# Patient Record
Sex: Male | Born: 1960
Health system: Southern US, Community
[De-identification: ages and names within clinical notes are randomized; demographics above are authoritative.]

## PROBLEM LIST (undated history)

## (undated) DIAGNOSIS — E785 Hyperlipidemia, unspecified: Secondary | ICD-10-CM

## (undated) DIAGNOSIS — F419 Anxiety disorder, unspecified: Secondary | ICD-10-CM

## (undated) HISTORY — PX: VASECTOMY: SHX75

## (undated) HISTORY — DX: Hyperlipidemia, unspecified: E78.5

## (undated) HISTORY — DX: Anxiety disorder, unspecified: F41.9

## (undated) HISTORY — PX: RHINOPLASTY: SUR1284

---

## 2005-07-02 ENCOUNTER — Ambulatory Visit: Payer: Self-pay | Admitting: Family Medicine

## 2005-07-02 LAB — CONVERTED CEMR LAB: PSA: 0.27 ng/mL

## 2006-06-20 ENCOUNTER — Encounter: Payer: Self-pay | Admitting: Family Medicine

## 2006-08-17 ENCOUNTER — Encounter: Payer: Self-pay | Admitting: Family Medicine

## 2008-01-18 ENCOUNTER — Encounter: Payer: Self-pay | Admitting: Family Medicine

## 2010-01-14 ENCOUNTER — Telehealth (INDEPENDENT_AMBULATORY_CARE_PROVIDER_SITE_OTHER): Payer: Self-pay | Admitting: *Deleted

## 2010-01-16 ENCOUNTER — Ambulatory Visit: Payer: Self-pay | Admitting: Family Medicine

## 2010-01-17 LAB — CONVERTED CEMR LAB
ALT: 17 units/L (ref 0–53)
AST: 19 units/L (ref 0–37)
Albumin: 4 g/dL (ref 3.5–5.2)
Alkaline Phosphatase: 79 units/L (ref 39–117)
BUN: 15 mg/dL (ref 6–23)
Basophils Absolute: 0 10*3/uL (ref 0.0–0.1)
Basophils Relative: 0.5 % (ref 0.0–3.0)
Bilirubin, Direct: 0.1 mg/dL (ref 0.0–0.3)
CO2: 31 meq/L (ref 19–32)
Calcium: 9.5 mg/dL (ref 8.4–10.5)
Chloride: 103 meq/L (ref 96–112)
Cholesterol: 214 mg/dL — ABNORMAL HIGH (ref 0–200)
Creatinine, Ser: 0.9 mg/dL (ref 0.4–1.5)
Direct LDL: 132 mg/dL
Eosinophils Absolute: 0.2 10*3/uL (ref 0.0–0.7)
Eosinophils Relative: 3.3 % (ref 0.0–5.0)
GFR calc non Af Amer: 98.33 mL/min (ref >60.00)
Glucose, Bld: 94 mg/dL (ref 70–99)
HCT: 42.1 % (ref 39.0–52.0)
HDL: 55.4 mg/dL (ref >39.00)
Hemoglobin: 14.3 g/dL (ref 13.0–17.0)
Lymphocytes Relative: 31.7 % (ref 12.0–46.0)
Lymphs Abs: 1.5 10*3/uL (ref 0.7–4.0)
MCHC: 33.9 g/dL (ref 30.0–36.0)
MCV: 94.2 fL (ref 78.0–100.0)
Monocytes Absolute: 0.5 10*3/uL (ref 0.1–1.0)
Monocytes Relative: 10.4 % (ref 3.0–12.0)
Neutro Abs: 2.5 10*3/uL (ref 1.4–7.7)
Neutrophils Relative %: 54.1 % (ref 43.0–77.0)
PSA: 0.31 ng/mL (ref 0.10–4.00)
Platelets: 269 10*3/uL (ref 150.0–400.0)
Potassium: 4.6 meq/L (ref 3.5–5.1)
RBC: 4.47 M/uL (ref 4.22–5.81)
RDW: 13.9 % (ref 11.5–14.6)
Sodium: 141 meq/L (ref 135–145)
TSH: 1.51 microintl units/mL (ref 0.35–5.50)
Total Bilirubin: 0.8 mg/dL (ref 0.3–1.2)
Total CHOL/HDL Ratio: 4
Total Protein: 6.4 g/dL (ref 6.0–8.3)
Triglycerides: 85 mg/dL (ref 0.0–149.0)
VLDL: 17 mg/dL (ref 0.0–40.0)
WBC: 4.6 10*3/uL (ref 4.5–10.5)

## 2010-01-21 ENCOUNTER — Ambulatory Visit: Payer: Self-pay | Admitting: Family Medicine

## 2010-01-21 ENCOUNTER — Encounter: Payer: Self-pay | Admitting: Family Medicine

## 2010-01-21 DIAGNOSIS — H9319 Tinnitus, unspecified ear: Secondary | ICD-10-CM | POA: Insufficient documentation

## 2010-01-21 DIAGNOSIS — F4389 Other reactions to severe stress: Secondary | ICD-10-CM | POA: Insufficient documentation

## 2010-01-21 DIAGNOSIS — F438 Other reactions to severe stress: Secondary | ICD-10-CM

## 2010-01-21 DIAGNOSIS — R079 Chest pain, unspecified: Secondary | ICD-10-CM | POA: Insufficient documentation

## 2010-02-04 ENCOUNTER — Encounter: Payer: Self-pay | Admitting: Family Medicine

## 2010-02-06 ENCOUNTER — Ambulatory Visit
Admission: RE | Admit: 2010-02-06 | Discharge: 2010-02-06 | Payer: Self-pay | Source: Home / Self Care | Attending: Family Medicine | Admitting: Family Medicine

## 2010-02-06 ENCOUNTER — Other Ambulatory Visit: Payer: Self-pay | Admitting: Family Medicine

## 2010-02-06 LAB — FECAL OCCULT BLOOD, GUAIAC: Fecal Occult Blood: NEGATIVE

## 2010-02-06 LAB — FECAL OCCULT BLOOD, IMMUNOCHEMICAL: Fecal Occult Bld: NEGATIVE

## 2010-02-09 ENCOUNTER — Encounter: Payer: Self-pay | Admitting: Family Medicine

## 2010-03-05 NOTE — Letter (Signed)
Summary: Results Follow up Letter  [place] [facility]  [address] [place], [place] [address]   Phone: [phone]  Fax: [phone]    02/09/2010 MRN: [id number]    [facility] [address] [place], [place]  [address]    Dear Mr. Bourassa,  The following are the results of your recent test(s):  Test         Result    Pap Smear:        Normal _____  Not Normal _____ Comments: ______________________________________________________ Cholesterol: LDL(Bad cholesterol):         Your goal is less than:         HDL (Good cholesterol):       Your goal is more than: Comments:  ______________________________________________________ Mammogram:        Normal _____  Not Normal _____ Comments:  ___________________________________________________________________ Hemoccult:        Normal __X___  Not normal _______ Comments:Stool card was negative.  _____________________________________________________________________ Other Tests:    We routinely do not discuss normal results over the telephone.  If you desire a copy of the results, or you have any questions about this information we can discuss them at your next office visit.   Sincerely,    Idamae Schuller Quanta Roher,MD  MT/ri

## 2010-03-05 NOTE — Progress Notes (Signed)
----   Converted from flag ---- ---- 01/13/2010 10:05 PM, Colon Flattery Tower MD wrote: please check wellness/ lipid and psa for v70.0 and prostate screen - thanks  ---- 01/13/2010 9:51 AM, Liane Comber CMA (AAMA) wrote: Lab orders please! Good Morning! This pt is scheduled for cpx labs Wed, which labs to draw and dx codes to use? Thanks Tasha ------------------------------

## 2010-03-05 NOTE — Miscellaneous (Signed)
Summary: flu vaccine  Clinical Lists Changes  Observations: Added new observation of FLU VAX: Historical received at [facility] (02/04/2010 16:13)      Immunization History:  Influenza Immunization History:    Influenza:  historical received at [facility] (02/04/2010)

## 2010-03-05 NOTE — Assessment & Plan Note (Signed)
Summary: cpx/alc   Vital Signs:  Patient profile:   50 year old male Height:      67.75 inches Weight:      164.75 pounds BMI:     25.33 Temp:     98.4 degrees F oral Pulse rate:   76 / minute Pulse rhythm:   regular BP sitting:   132 / 80  (right arm) Cuff size:   regular  Vitals Entered By: Lewanda Rife LPN (January 21, 2010 2:32 PM) CC: CPX   History of Present Illness: here for wellness exam and to disc chronic med problems   is feeling good - no new medical problems  is working a lot  is having some tightness around L chest at times  happens as rxn to stress and anx  exercise actually helps  no sweating or shortness of breath     bmi is 25- this is good   bp is 132/80 today  psa good at .31 prostate hx -- no nocturia  flow is slower as he gets older -- longer to empty all the way  this is tolerable     lipids are up a bit from 08 trig 85 and HDL 55 and LDL 132 (was 126 in past) does like ice cream -- less now  has eaten more bacon lately  some eggs   is taking some glucosamine for aches and pains ? if it helps    Td 04 flu shot -- has not had yet   colon screening   -- does not want to do colonoscopy yet  is open to doing the stool card  very little red meat - and eats a lot f fiber  some memory issues -- could not remember how to tie a tie from years ago  wife has to remind him of things has a lot of stress and stays preoccupied with things much more anxiety lately    has an EAP program for counseling   is curoius about testosterone level  no symptoms -- but wife was noticing change in drive  (he does not notice this )   has some mild sensoryneural hearing loss  then it got better and then got worse  popping and congesiton in L ear  R ear ringing     Allergies (verified): 1)  ! 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 Eyes:  Denies blurring and eye irritation. ENT:  Complains of ringing in ears. CV:  Complains of chest pain or discomfort; denies palpitations, shortness of breath with exertion, and swelling of feet. Resp:  Denies cough, shortness of breath, sputum productive, and wheezing. GI:  Denies abdominal pain, bloody  stools, change in bowel habits, indigestion, and nausea. GU:  Complains of urinary hesitancy. MS:  Denies joint pain, joint redness, joint swelling, and cramps. Derm:  Denies itching, lesion(s), poor wound healing, and rash. Neuro:  Denies headaches, numbness, and tingling. Psych:  Complains of anxiety and irritability; denies suicidal thoughts/plans. Endo:  Denies cold intolerance, excessive thirst, excessive urination, and heat intolerance. Heme:  Denies abnormal bruising and bleeding.  Physical Exam  General:  Well-developed,well-nourished,in no acute distress; alert,appropriate and cooperative throughout examination Head:  normocephalic, atraumatic, and no abnormalities observed.   Eyes:  vision grossly intact, pupils equal, pupils round, and pupils reactive to light.  no  conjunctival pallor, injection or icterus  Ears:  R ear normal and L ear normal.   Mouth:  pharynx pink and moist.   Neck:  supple with full rom and no masses or thyromegally, no JVD or carotid bruit  Chest Wall:  No deformities, masses, tenderness or gynecomastia noted. Lungs:  Normal respiratory effort, chest expands symmetrically. Lungs are clear to auscultation, no crackles or wheezes. Heart:  Normal rate and regular rhythm. S1 and S2 normal without gallop, murmur, click, rub or other extra sounds. Abdomen:  Bowel sounds positive,abdomen soft and non-tender without masses, organomegaly or hernias noted. no renal bruits  Rectal:  No external abnormalities noted. Normal sphincter tone. No rectal masses or tenderness. Prostate:  Prostate gland firm and smooth, no enlargement, nodularity, tenderness, mass, asymmetry or induration. Msk:  No deformity or scoliosis noted of thoracic or lumbar spine.  no acute joint changes  Pulses:  R and L carotid,radial,femoral,dorsalis pedis and posterior tibial pulses are full and equal bilaterally Neurologic:  sensation intact to light touch, gait normal, and DTRs symmetrical and normal.   Skin:  Intact without suspicious lesions or rashes Cervical Nodes:  No lymphadenopathy noted Inguinal Nodes:  No significant adenopathy Psych:  seems fatigued and generally stressed/ anxious     Impression & Recommendations:  Problem # 1:  HEALTH MAINTENANCE EXAM (ICD-V70.0) Assessment Comment Only reviewed health habits including diet, exercise and skin cancer prevention reviewed health maintenance list and family history  rev wellness labs in detail  Problem # 2:  ANXIETY, SITUATIONAL (ICD-308.3) Assessment: New disc stressors and coping mech in detail as well as symptoms and opt for tx  do recommend counseling  pt will approach his eap program for this  declines meds at this time some physical symptomatology- incl occ cp and also memory problems/ sense of  being overwhelmed  spent 25 minutes face to face time with pt , over 50% of which was spent on counseling and coordination of care    Problem # 3:  CHEST PAIN (ICD-786.50) Assessment: New  suspect this is from anxiety and stress nl ekg disc s/s/ angina to watch for  made strategy for stress and anxiety   Orders: EKG w/ Interpretation (93000)  Problem # 4:  SPECIAL SCREENING MALIGNANT NEOPLASM OF PROSTATE (ICD-V76.44) some slowing of stream but nl exam  will continue to watch  Problem # 5:  TINNITUS (ICD-388.30) Assessment: Unchanged this sounds chronic - adv pt to f/u with ENT if no further improvement  Complete Medication List: 1)  Fish Oil 1000 Mg Caps (Omega-3 fatty acids) .... Take 1 capsule by mouth once a day 2)  Glucosamine-chondroitin-msm 400-333.3-200 Mg Tabs (Glucosamine-chondroitin-msm) .... Otc as directed.  Patient Instructions: 1)  you can raise your HDL (good cholesterol) by increasing exercise and eating omega 3 fatty acid supplement like fish oil or  flax seed oil over the counter 2)  you can lower LDL (bad cholesterol) by limiting saturated fats in diet like red meat, fried foods, egg yolks, fatty breakfast meats, high fat dairy products and shellfish  3)  if you get a chance - get a flu shot at a pharmacy  4)  I recommend you do seek out some talk therapy with EAP program  5)  if further memory problems or other symptoms - please follow up    Orders Added: 1)  New Patient 40-64 years [99386] 2)  New Patient Level III [99203] 3)  EKG w/ Interpretation [93000]    Current Allergies (reviewed today): ! * SHELLFISH    EKG  Procedure date:  01/21/2010  Findings:      rate of 69, nsr and no acute changes

## 2010-03-05 NOTE — Letter (Signed)
Summary: [facility]: Immunoassay Fecal Occult Blood (iFOB) Order Form  [place] [facility]  [address] [place], [place] [address]   Phone: [phone]  Fax: [phone]      [facility]: Immunoassay Fecal Occult Blood (iFOB) Order Form   January 21, 2010 MRN: [id number]   Patrick Mcpherson 03/08/1960   Physicican Name:_______________Tower__________  Diagnosis Code:_______________V76.51___________      Judith Part MD

## 2010-09-24 ENCOUNTER — Telehealth: Payer: Self-pay | Admitting: *Deleted

## 2010-09-24 NOTE — Telephone Encounter (Signed)
Pt has dropped off a form for his insurance that he needs completed, form is on your shelf.

## 2010-09-25 DIAGNOSIS — Z0279 Encounter for issue of other medical certificate: Secondary | ICD-10-CM

## 2010-09-25 NOTE — Telephone Encounter (Signed)
Done

## 2010-10-02 ENCOUNTER — Encounter: Payer: Self-pay | Admitting: Family Medicine

## 2010-10-02 ENCOUNTER — Ambulatory Visit (INDEPENDENT_AMBULATORY_CARE_PROVIDER_SITE_OTHER): Payer: BC Managed Care – PPO | Admitting: Family Medicine

## 2010-10-02 VITALS — BP 102/65 | HR 65 | Temp 98.3°F | Wt 161.1 lb

## 2010-10-02 DIAGNOSIS — H109 Unspecified conjunctivitis: Secondary | ICD-10-CM

## 2010-10-02 MED ORDER — POLYMYXIN B-TRIMETHOPRIM 10000-0.1 UNIT/ML-% OP SOLN: 1.0000 [drp] | OPHTHALMIC | Status: AC

## 2010-10-02 NOTE — Patient Instructions (Signed)
Use the drops and let us know if you have other concerns.  Take care.

## 2010-10-02 NOTE — Progress Notes (Signed)
"  [place]."  R eye had some extra crusting in the AM starting Sunday.  Then noted some irritation in the R lateral lid junction. Then this AM with much more yellow discharge from the R eye.  Was crusted shut this AM.  No vision change.  Lids feel irritated but no true eye pain.  No sick contacts.  No FCNAV, no URI sx.  No motor change in EO muscles, no double vision.   No photophobia.    Meds, vitals, and allergies reviewed.   ROS: See HPI.  Otherwise, noncontributory.  nad ncat Tm wnl  Nasal and op wnl Fundus wnl x2, perrl, EOMI No FB seen x2.  L lids wnl.  R lids irritated and injected.  Limbus sparing conjunctival injection in R eye.

## 2010-10-03 NOTE — Assessment & Plan Note (Signed)
No FB and no vision change.  Okay for outpatient f/u. D/w pt about measures to try to limit spread and use abx drops in meantime. He agrees.  F/u prn.

## 2010-11-23 ENCOUNTER — Telehealth: Payer: Self-pay | Admitting: Family Medicine

## 2010-11-23 DIAGNOSIS — Z125 Encounter for screening for malignant neoplasm of prostate: Secondary | ICD-10-CM | POA: Insufficient documentation

## 2010-11-23 DIAGNOSIS — Z0000 Encounter for general adult medical examination without abnormal findings: Secondary | ICD-10-CM | POA: Insufficient documentation

## 2010-11-23 NOTE — Telephone Encounter (Signed)
Message copied by Judy Pimple on Mon Nov 23, 2010  9:04 PM ------      Message from: Patrick Mcpherson      Created: Wed Nov 18, 2010  9:50 AM      Regarding: Cpx labs Tues10/23       Please order  future cpx labs for pt's upcomming lab appt.      Thanks      Rodney Booze

## 2010-11-24 ENCOUNTER — Other Ambulatory Visit (INDEPENDENT_AMBULATORY_CARE_PROVIDER_SITE_OTHER): Payer: BC Managed Care – PPO

## 2010-11-24 DIAGNOSIS — Z0000 Encounter for general adult medical examination without abnormal findings: Secondary | ICD-10-CM

## 2010-11-24 DIAGNOSIS — Z125 Encounter for screening for malignant neoplasm of prostate: Secondary | ICD-10-CM

## 2010-11-24 LAB — CBC WITH DIFFERENTIAL/PLATELET
Basophils Absolute: 0 K/uL (ref 0.0–0.1)
Basophils Relative: 0.5 % (ref 0.0–3.0)
Eosinophils Absolute: 0.1 K/uL (ref 0.0–0.7)
Eosinophils Relative: 2.1 % (ref 0.0–5.0)
HCT: 42.9 % (ref 39.0–52.0)
Hemoglobin: 14.6 g/dL (ref 13.0–17.0)
Lymphocytes Relative: 24.3 % (ref 12.0–46.0)
Lymphs Abs: 1.3 K/uL (ref 0.7–4.0)
MCHC: 34.1 g/dL (ref 30.0–36.0)
MCV: 94.2 fl (ref 78.0–100.0)
Monocytes Absolute: 0.5 K/uL (ref 0.1–1.0)
Monocytes Relative: 9.3 % (ref 3.0–12.0)
Neutro Abs: 3.5 K/uL (ref 1.4–7.7)
Neutrophils Relative %: 63.8 % (ref 43.0–77.0)
Platelets: 256 K/uL (ref 150.0–400.0)
RBC: 4.55 Mil/uL (ref 4.22–5.81)
RDW: 13.8 % (ref 11.5–14.6)
WBC: 5.4 K/uL (ref 4.5–10.5)

## 2010-11-24 LAB — COMPREHENSIVE METABOLIC PANEL
Albumin: 4.1 g/dL (ref 3.5–5.2)
BUN: 16 mg/dL (ref 6–23)
CO2: 29 mEq/L (ref 19–32)
Calcium: 9.1 mg/dL (ref 8.4–10.5)
Chloride: 104 mEq/L (ref 96–112)
Glucose, Bld: 98 mg/dL (ref 70–99)
Potassium: 4.3 mEq/L (ref 3.5–5.1)
Sodium: 139 mEq/L (ref 135–145)
Total Protein: 6.5 g/dL (ref 6.0–8.3)

## 2010-11-24 LAB — LDL CHOLESTEROL, DIRECT: Direct LDL: 120.9 mg/dL

## 2010-11-24 LAB — LIPID PANEL
Cholesterol: 201 mg/dL — ABNORMAL HIGH (ref 0–200)
Triglycerides: 71 mg/dL (ref 0.0–149.0)

## 2010-11-24 LAB — TSH: TSH: 1.43 u[IU]/mL (ref 0.35–5.50)

## 2010-12-01 ENCOUNTER — Encounter: Payer: Self-pay | Admitting: Family Medicine

## 2010-12-02 ENCOUNTER — Encounter: Payer: Self-pay | Admitting: Family Medicine

## 2010-12-02 ENCOUNTER — Ambulatory Visit (INDEPENDENT_AMBULATORY_CARE_PROVIDER_SITE_OTHER): Payer: BC Managed Care – PPO | Admitting: Family Medicine

## 2010-12-02 VITALS — BP 134/76 | HR 76 | Temp 98.3°F | Ht 68.0 in | Wt 162.5 lb

## 2010-12-02 DIAGNOSIS — Z125 Encounter for screening for malignant neoplasm of prostate: Secondary | ICD-10-CM

## 2010-12-02 DIAGNOSIS — R0609 Other forms of dyspnea: Secondary | ICD-10-CM

## 2010-12-02 DIAGNOSIS — F329 Major depressive disorder, single episode, unspecified: Secondary | ICD-10-CM

## 2010-12-02 DIAGNOSIS — R5381 Other malaise: Secondary | ICD-10-CM

## 2010-12-02 DIAGNOSIS — F3289 Other specified depressive episodes: Secondary | ICD-10-CM

## 2010-12-02 DIAGNOSIS — R0683 Snoring: Secondary | ICD-10-CM | POA: Insufficient documentation

## 2010-12-02 DIAGNOSIS — F32A Depression, unspecified: Secondary | ICD-10-CM | POA: Insufficient documentation

## 2010-12-02 DIAGNOSIS — Z0000 Encounter for general adult medical examination without abnormal findings: Secondary | ICD-10-CM

## 2010-12-02 DIAGNOSIS — R5383 Other fatigue: Secondary | ICD-10-CM | POA: Insufficient documentation

## 2010-12-02 DIAGNOSIS — Z23 Encounter for immunization: Secondary | ICD-10-CM

## 2010-12-02 NOTE — Patient Instructions (Addendum)
Flu shot today Testosterone level today  If your depression worsens- please follow up to discuss therapy Labs look good  If snoring worsens or becomes more persistant- would consider ENT work up

## 2010-12-02 NOTE — Progress Notes (Signed)
Subjective:    Patient ID: Patrick Mcpherson, male    DOB: [birth year], 50 y.o.   MRN: [id number]  HPI Here for annual health mt exam and to review chronic med problems Is doing well in general   Has struggled with some depression - went to EAP counselor who diagnosed him  Worked on it with the counselor  Started as a problem with anger / irritability  Lots of stress in his life-- major changes and lay offs at work , this has calmed down now  (was in a toxic environment)  It is improved for several months - is glad for that  Does not feel like he needs meds at this time    bp today 134/76- fairly good   Wt is up 1 lb with good bmi of 24  Lipids - diet controlled Lab Results  Component Value Date   CHOL 201* 11/24/2010   CHOL 214* 01/14/2010   Lab Results  Component Value Date   HDL 62.30 11/24/2010   HDL 55.40 01/14/2010   No results found for this basename: [facility]   Lab Results  Component Value Date   TRIG 71.0 11/24/2010   TRIG 85.0 01/14/2010   Lab Results  Component Value Date   CHOLHDL 3 11/24/2010   CHOLHDL 4 01/14/2010   Lab Results  Component Value Date   LDLDIRECT 120.9 11/24/2010   LDLDIRECT 132.0 01/14/2010    Diet-is eating healthier- cut out the ice cream  Exercised more -- this summer (had ankle problem before - getting better )   psa nl  No prostate problems  No flow issues No nocturia  He does want to know what his testosterone level is -- more tired than he used to be Libido seems ok     Colon cancer screen- has not had that -not interested yet  Did ifob last year -negative    Has not had flu shot yet -- needs it today  Td 04  Snores occasionally No excess fatigue or apnea  Wine maybe 2 nights per week   Patient Active Problem List  Diagnoses  . ANXIETY, SITUATIONAL  . TINNITUS  . Conjunctivitis  . Routine general medical examination at a health care facility  . Prostate cancer screening  . Snoring  . Depression  .  Fatigue   Past Medical History  Diagnosis Date  . Anxiety     Stress reaction  . Hyperlipidemia     borderline   Past Surgical History  Procedure Date  . Vasectomy   . Rhinoplasty    History  Substance Use Topics  . Smoking status: Never Smoker   . Smokeless tobacco: Not on file  . Alcohol Use: Yes     Small amount   Family History  Problem Relation Age of Onset  . Heart disease Mother     CAD, bypass  . Diabetes Mother   . Cancer Father     Lymphoma  . Cancer Other     Prostate CA in older years   Allergies  Allergen Reactions  . Shellfish Allergy     REACTION: dizzy and nauseated   Current Outpatient Prescriptions on File Prior to Visit  Medication Sig Dispense Refill  . fish oil-omega-3 fatty acids 1000 MG capsule Take 1 g by mouth daily.        . Glucosamine-Chondroitin-MSM 400-333.3-200 MG TABS Take by mouth as directed.            Review of Systems Review of  Systems  Constitutional: Negative for fever, appetite change,  and unexpected weight change. some fatigue  Eyes: Negative for pain and visual disturbance.  Respiratory: Negative for cough and shortness of breath.   Cardiovascular: Negative for cp or palpitations    Gastrointestinal: Negative for nausea, diarrhea and constipation.  Genitourinary: Negative for urgency and frequency.  Skin: Negative for pallor or rash   Neurological: Negative for weakness, light-headedness, numbness and headaches.  Hematological: Negative for adenopathy. Does not bruise/bleed easily.  Psychiatric/Behavioral: Negative for dysphoric mood(now - is improved). The patient is not nervous/anxious.          Objective:   Physical Exam  Constitutional: He appears well-developed and well-nourished. No distress.  HENT:  Head: Normocephalic and atraumatic.  Right Ear: External ear normal.  Left Ear: External ear normal.  Nose: Nose normal.  Mouth/Throat: Oropharynx is clear and moist.  Eyes: Conjunctivae and EOM are  normal. Pupils are equal, round, and reactive to light. No scleral icterus.  Neck: Normal range of motion. Neck supple. No JVD present. Carotid bruit is not present. No thyromegaly present.  Cardiovascular: Normal rate, regular rhythm, normal heart sounds and intact distal pulses.  Exam reveals no gallop.   Pulmonary/Chest: Effort normal and breath sounds normal. No respiratory distress. He has no wheezes.  Abdominal: Soft. Bowel sounds are normal. He exhibits no distension, no abdominal bruit and no mass. There is no tenderness.  Genitourinary: Rectum normal and prostate normal. Guaiac negative stool.  Musculoskeletal: Normal range of motion. He exhibits no edema and no tenderness.  Lymphadenopathy:    He has no cervical adenopathy.  Neurological: He is alert. He has normal reflexes. No cranial nerve deficit. Coordination normal.  Skin: Skin is warm and dry. No rash noted. No erythema. No pallor.  Psychiatric: He has a normal mood and affect.          Assessment & Plan:

## 2010-12-03 LAB — TESTOSTERONE, FREE, TOTAL, SHBG
Testosterone-% Free: 2 % (ref 1.6–2.9)
Testosterone: 322.05 ng/dL (ref 250–890)

## 2010-12-03 NOTE — Assessment & Plan Note (Signed)
While this could be schedule or age rel- pt wants to do testosterone level  Will check that today  Other labs ok

## 2010-12-03 NOTE — Assessment & Plan Note (Signed)
This was transient and stress related  Much imp now after counseling  Will watch for return of symptoms Pt would rather avoid meds inst to keep me updated

## 2010-12-03 NOTE — Assessment & Plan Note (Signed)
DRE done -normal No symptoms psa ok  Will continue to follow

## 2010-12-03 NOTE — Assessment & Plan Note (Signed)
This is sporatic/ intermittent without witnessed apnea or unrested ams Will continue to follow Pt is not obese Will update if this becomes more persistant and would consider ENT consult

## 2010-12-03 NOTE — Assessment & Plan Note (Signed)
Reviewed health habits including diet and exercise and skin cancer prevention Also reviewed health mt list, fam hx and immunizations   Wellness labs reviewed  

## 2011-07-01 ENCOUNTER — Encounter: Payer: Self-pay | Admitting: Family Medicine

## 2011-07-01 ENCOUNTER — Ambulatory Visit (INDEPENDENT_AMBULATORY_CARE_PROVIDER_SITE_OTHER): Payer: BC Managed Care – PPO | Admitting: Family Medicine

## 2011-07-01 VITALS — BP 102/60 | HR 87 | Temp 99.6°F | Ht 68.0 in | Wt 166.2 lb

## 2011-07-01 DIAGNOSIS — R12 Heartburn: Secondary | ICD-10-CM

## 2011-07-01 DIAGNOSIS — K5289 Other specified noninfective gastroenteritis and colitis: Secondary | ICD-10-CM

## 2011-07-01 DIAGNOSIS — K529 Noninfective gastroenteritis and colitis, unspecified: Secondary | ICD-10-CM

## 2011-07-01 NOTE — Progress Notes (Signed)
Patient Name: Patrick Mcpherson Date of Birth: [birth year] Medical Record Number: [id number] Gender: male Date of Encounter: 07/01/2011  History of Present Illness:  Patrick Mcpherson is a 51 y.o. very pleasant male patient who presents with the following:  Started late in the day on Tuesday and some heartburn by the end of the night, and then had some liquid into his esophagus. Then into the morning had a little bit of diarrhea. No appetitie. No and minimal food over the past few days.  100.6 at home. Last night, had some drainage.   He had a severe vomiting illness earlier in the year in February. Since then he has had some occasional heartburn.  Now he has worsening abdominal pain as well as some diarrhea, but is improving today. Decreased by mouth over the last few days the  Patient Active Problem List  Diagnoses  . ANXIETY, SITUATIONAL  . TINNITUS  . Routine general medical examination at a health care facility  . Prostate cancer screening  . Snoring  . Depression  . Fatigue   Past Medical History  Diagnosis Date  . Anxiety     Stress reaction  . Hyperlipidemia     borderline   Past Surgical History  Procedure Date  . Vasectomy   . Rhinoplasty    History  Substance Use Topics  . Smoking status: Never Smoker   . Smokeless tobacco: Not on file  . Alcohol Use: Yes     Small amount   Family History  Problem Relation Age of Onset  . Heart disease Mother     CAD, bypass  . Diabetes Mother   . Cancer Father     Lymphoma  . Cancer Other     Prostate CA in older years   Allergies  Allergen Reactions  . Shellfish Allergy     REACTION: dizzy and nauseated    Medication list has been reviewed and updated.  Prior to Admission medications   Medication Sig Start Date End Date Taking? Authorizing Provider  fish oil-omega-3 fatty acids 1000 MG capsule Take 1 g by mouth daily.     Yes Historical Provider, MD    Review of Systems: ROS: GEN: Acute illness details  above GI: above GU: maintaining adequate hydration and urination Pulm: No SOB Interactive and getting along well at home.  Otherwise, ROS is as per the HPI.   Physical Examination: Filed Vitals:   07/01/11 1357  BP: 102/60  Pulse: 87  Temp: 99.6 F (37.6 C)   Filed Vitals:   07/01/11 1357  Height: 5\' 8"  (1.727 m)  Weight: 166 lb 4 oz (75.411 kg)   Body mass index is 25.28 kg/(m^2).  GEN: WDWN, NAD, Non-toxic, A & O x 3 HEENT: Atraumatic, Normocephalic. Neck supple. No masses, No LAD. Ears and Nose: No external deformity. CV: RRR, No M/G/R. No JVD. No thrill. No extra heart sounds. PULM: CTA B, no wheezes, crackles, rhonchi. No retractions. No resp. distress. No accessory muscle use. ABD: S, NT, ND, +BS. No rebound. No HSM. EXTR: No c/c/e NEURO Normal gait.  PSYCH: Normally interactive. Conversant. Not depressed or anxious appearing.  Calm demeanor.    Assessment and Plan:  1. Heartburn   2. Gastroenteritis    Refer to the patient instructions sections for details of plan shared with patient.   Orders Today: No orders of the defined types were placed in this encounter.    Medications Today: No orders of the defined types were placed in this  encounter.     Hannah Beat, MD 07/01/2011 2:08 PM

## 2011-07-01 NOTE — Patient Instructions (Signed)
Generic Zantac  GERD/Dyspepsia  Very Common Backflow of stomach acid and contents into esophagus Causes heartburn Can cause chest pain, difficulty swallowing  Prevention 1. Do not smoke 2. Lose weight if overweight 3. Elevate bed 4-6 inches 4. Some foods worsen: chocolate, mint, alcohol, OJ, caffeine, fat, fried or spicy food.  Treatment 1. Mild to Moderate: OTC Zantac or Pepcid often works, take 6-12 weeks, then as needed 2. More severe: OTC Prilosec or OTC Prevacid, Often needed 3-6 months or on daily basis   Prevent dehydration: drink Gatorade, Pedialyte, Ginger Ale, popsicles  Immodium A-D over ther counter or Pepto-Bismol   day 1: clear liquids day 1: clear liquids--2-3 oz every 45-60 min. SMALL SIPS OR ICE CHIPS 7-up, ginger ale, sprite tea--no cofee chicken broth plain jello Water  Day 2: contnue clear liquids and add BRAT diet B--banana R--rice---can use chicken noodle or rice soup A--apple sauce T--dry toast GRITS, CREAM OF WHEAT, OATMEAL OK  Day 3: continue day 2 and add simple, non fat non spicy foods1 at a time to diet as canned peaches or pears backed or broiled chicken breast---or lunch meat boiled white potato-cook in chicken broth Chicken and rice or chicken noodles

## 2011-09-29 ENCOUNTER — Encounter: Payer: Self-pay | Admitting: Family Medicine

## 2011-09-29 ENCOUNTER — Telehealth: Payer: Self-pay

## 2011-09-29 ENCOUNTER — Ambulatory Visit (INDEPENDENT_AMBULATORY_CARE_PROVIDER_SITE_OTHER): Payer: BC Managed Care – PPO | Admitting: Family Medicine

## 2011-09-29 VITALS — BP 113/75 | HR 59 | Temp 98.0°F | Ht 69.0 in | Wt 160.5 lb

## 2011-09-29 DIAGNOSIS — Z125 Encounter for screening for malignant neoplasm of prostate: Secondary | ICD-10-CM

## 2011-09-29 DIAGNOSIS — Z1211 Encounter for screening for malignant neoplasm of colon: Secondary | ICD-10-CM

## 2011-09-29 DIAGNOSIS — Z0000 Encounter for general adult medical examination without abnormal findings: Secondary | ICD-10-CM

## 2011-09-29 LAB — COMPREHENSIVE METABOLIC PANEL
Alkaline Phosphatase: 86 U/L (ref 39–117)
CO2: 29 mEq/L (ref 19–32)
Creatinine, Ser: 0.9 mg/dL (ref 0.4–1.5)
GFR: 93.93 mL/min (ref >60.00)
Glucose, Bld: 89 mg/dL (ref 70–99)
Total Bilirubin: 1.1 mg/dL (ref 0.3–1.2)

## 2011-09-29 LAB — CBC WITH DIFFERENTIAL/PLATELET
Basophils Absolute: 0 10*3/uL (ref 0.0–0.1)
Eosinophils Relative: 1.9 % (ref 0.0–5.0)
HCT: 44.7 % (ref 39.0–52.0)
Hemoglobin: 14.8 g/dL (ref 13.0–17.0)
Lymphs Abs: 1.4 10*3/uL (ref 0.7–4.0)
MCV: 94.9 fl (ref 78.0–100.0)
Monocytes Absolute: 0.4 10*3/uL (ref 0.1–1.0)
Monocytes Relative: 8.9 % (ref 3.0–12.0)
Neutro Abs: 2.4 10*3/uL (ref 1.4–7.7)
RDW: 13.5 % (ref 11.5–14.6)

## 2011-09-29 LAB — LIPID PANEL
HDL: 61.1 mg/dL (ref >39.00)
Triglycerides: 47 mg/dL (ref 0.0–149.0)

## 2011-09-29 LAB — TSH: TSH: 1.32 u[IU]/mL (ref 0.35–5.50)

## 2011-09-29 NOTE — Assessment & Plan Note (Signed)
Pt declines colonoscopy at this time so will do IFOB

## 2011-09-29 NOTE — Patient Instructions (Addendum)
Labs today Great job with diet and exercise Please do stool card for colon cancer screening

## 2011-09-29 NOTE — Assessment & Plan Note (Signed)
Reviewed health habits including diet and exercise and skin cancer prevention Also reviewed health mt list, fam hx and immunizations  Doing great with health habits and lost some wt Wellness labs today Declined colonosc-will do stool card

## 2011-09-29 NOTE — Progress Notes (Signed)
Subjective:    Patient ID: Patrick Mcpherson, male    DOB: 07-08-1960, 51 y.o.   MRN: [id number]  HPI Here for health maintenance exam and to review chronic medical problems   Has had a good summer - but busy in general   Last visit was last fall- ins said that is ok   Wt is down 6 lb with bmi of 23 He is eating well and exercising more (swimming and biking and walking)  Also had a GI virus in the spring - started loosing then  Has cut out the sweets also  Will get blood work today    Colon cancer screen- has not had a colonoscopy -- declines that  Would rather do the stool card  No bowel changes    imms Had flu shot last season - will get one this fall  Tetanus shot in 04  Lab Results  Component Value Date   CHOL 201* 11/24/2010   HDL 62.30 11/24/2010   LDLDIRECT 120.9 11/24/2010   TRIG 71.0 11/24/2010   CHOLHDL 3 11/24/2010   I expect it will be improved this year   Lab Results  Component Value Date   PSA 0.31 11/24/2010   PSA 0.31 01/14/2010   PSA 0.27 07/02/2005    No prostate problems  No nocturia  No prostate cancer in family   No colon cancer in family   Mother DM - she passed away last year  Father - lymphoma   Patient Active Problem List  Diagnosis  . ANXIETY, SITUATIONAL  . TINNITUS  . Routine general medical examination at a health care facility  . Prostate cancer screening  . Snoring  . Depression  . Fatigue   Past Medical History  Diagnosis Date  . Anxiety     Stress reaction  . Hyperlipidemia     borderline   Past Surgical History  Procedure Date  . Vasectomy   . Rhinoplasty    History  Substance Use Topics  . Smoking status: Never Smoker   . Smokeless tobacco: Not on file  . Alcohol Use: Yes     Small amount   Family History  Problem Relation Age of Onset  . Heart disease Mother     CAD, bypass  . Diabetes Mother   . Cancer Father     Lymphoma  . Cancer Other     Prostate CA in older years   Allergies  Allergen  Reactions  . Shellfish Allergy     REACTION: dizzy and nauseated   Current Outpatient Prescriptions on File Prior to Visit  Medication Sig Dispense Refill  . fish oil-omega-3 fatty acids 1000 MG capsule Take 1 g by mouth daily.           Review of Systems    Review of Systems  Constitutional: Negative for fever, appetite change, fatigue and unexpected weight change.  Eyes: Negative for pain and visual disturbance.  Respiratory: Negative for cough and shortness of breath.   Cardiovascular: Negative for cp or palpitations    Gastrointestinal: Negative for nausea, diarrhea and constipation.  Genitourinary: Negative for urgency and frequency.  Skin: Negative for pallor or rash   Neurological: Negative for weakness, light-headedness, numbness and headaches.  Hematological: Negative for adenopathy. Does not bruise/bleed easily.  Psychiatric/Behavioral: Negative for dysphoric mood. The patient is not nervous/anxious.      Objective:   Physical Exam  Constitutional: He is oriented to person, place, and time. He appears well-developed and well-nourished. No distress.  HENT:  Head: Normocephalic and atraumatic.  Right Ear: External ear normal.  Left Ear: External ear normal.  Nose: Nose normal.  Mouth/Throat: Oropharynx is clear and moist.  Eyes: Conjunctivae and EOM are normal. Pupils are equal, round, and reactive to light. No scleral icterus.  Neck: Normal range of motion. Neck supple. No JVD present. Carotid bruit is not present. No thyromegaly present.  Cardiovascular: Normal rate, regular rhythm, normal heart sounds and intact distal pulses.  Exam reveals no gallop.   Pulmonary/Chest: Effort normal and breath sounds normal. No respiratory distress. He has no wheezes.  Abdominal: Soft. Bowel sounds are normal. He exhibits no distension, no abdominal bruit and no mass. There is no tenderness.  Genitourinary: Rectum normal and prostate normal. Rectal exam shows no tenderness and anal  tone normal. Guaiac negative stool. Prostate is not enlarged and not tender.  Musculoskeletal: He exhibits no edema and no tenderness.  Lymphadenopathy:    He has no cervical adenopathy.  Neurological: He is alert and oriented to person, place, and time. He has normal reflexes. No cranial nerve deficit. He exhibits normal muscle tone. Coordination normal.  Skin: Skin is warm and dry. No rash noted. No erythema. No pallor.  Psychiatric: He has a normal mood and affect.          Assessment & Plan:

## 2011-09-29 NOTE — Assessment & Plan Note (Signed)
psa and DRE today No symptoms or problems

## 2011-09-29 NOTE — Telephone Encounter (Signed)
Pt saw Dr Milinda Antis today; did not get IFOB kit today; Asher Muir in the lab said pt can come by and pick up kit. Pt notified and understood.

## 2011-10-13 ENCOUNTER — Other Ambulatory Visit: Payer: BC Managed Care – PPO

## 2011-10-13 DIAGNOSIS — Z1211 Encounter for screening for malignant neoplasm of colon: Secondary | ICD-10-CM

## 2011-10-14 ENCOUNTER — Encounter: Payer: Self-pay | Admitting: *Deleted

## 2012-02-09 ENCOUNTER — Other Ambulatory Visit: Payer: BC Managed Care – PPO

## 2012-02-16 ENCOUNTER — Encounter: Payer: BC Managed Care – PPO | Admitting: Family Medicine

## 2012-08-17 ENCOUNTER — Other Ambulatory Visit: Payer: BC Managed Care – PPO

## 2012-08-20 ENCOUNTER — Telehealth: Payer: Self-pay | Admitting: Family Medicine

## 2012-08-20 DIAGNOSIS — Z0000 Encounter for general adult medical examination without abnormal findings: Secondary | ICD-10-CM

## 2012-08-20 DIAGNOSIS — Z125 Encounter for screening for malignant neoplasm of prostate: Secondary | ICD-10-CM

## 2012-08-20 NOTE — Telephone Encounter (Signed)
Message copied by Judy Pimple on Sun Aug 20, 2012  2:29 PM ------      Message from: Patrick Mcpherson      Created: Fri Aug 18, 2012  2:02 PM      Regarding: Cpx labs Mon 7/21       Please order  future cpx labs for pt's upcoming lab appt.      Thanks      Tasha             ------

## 2012-08-21 ENCOUNTER — Other Ambulatory Visit (INDEPENDENT_AMBULATORY_CARE_PROVIDER_SITE_OTHER): Payer: BC Managed Care – PPO

## 2012-08-21 DIAGNOSIS — R5383 Other fatigue: Secondary | ICD-10-CM

## 2012-08-21 DIAGNOSIS — R5381 Other malaise: Secondary | ICD-10-CM

## 2012-08-21 DIAGNOSIS — Z125 Encounter for screening for malignant neoplasm of prostate: Secondary | ICD-10-CM

## 2012-08-21 DIAGNOSIS — Z0000 Encounter for general adult medical examination without abnormal findings: Secondary | ICD-10-CM

## 2012-08-21 LAB — COMPREHENSIVE METABOLIC PANEL
ALT: 21 U/L (ref 0–53)
BUN: 20 mg/dL (ref 6–23)
CO2: 29 mEq/L (ref 19–32)
Calcium: 9.4 mg/dL (ref 8.4–10.5)
Chloride: 106 mEq/L (ref 96–112)
Creatinine, Ser: 0.8 mg/dL (ref 0.4–1.5)
GFR: 104.22 mL/min (ref >60.00)
Glucose, Bld: 94 mg/dL (ref 70–99)

## 2012-08-21 LAB — CBC WITH DIFFERENTIAL/PLATELET
Basophils Relative: 0.9 % (ref 0.0–3.0)
Eosinophils Relative: 4.6 % (ref 0.0–5.0)
HCT: 42.2 % (ref 39.0–52.0)
Hemoglobin: 14.1 g/dL (ref 13.0–17.0)
Lymphocytes Relative: 33.6 % (ref 12.0–46.0)
Monocytes Relative: 10.4 % (ref 3.0–12.0)
Neutro Abs: 2.2 10*3/uL (ref 1.4–7.7)
RBC: 4.39 Mil/uL (ref 4.22–5.81)

## 2012-08-21 LAB — LIPID PANEL
Cholesterol: 159 mg/dL (ref 0–200)
HDL: 72.1 mg/dL (ref >39.00)
Triglycerides: 31 mg/dL (ref 0.0–149.0)

## 2012-08-21 LAB — TSH: TSH: 1.55 u[IU]/mL (ref 0.35–5.50)

## 2012-08-30 ENCOUNTER — Other Ambulatory Visit: Payer: BC Managed Care – PPO

## 2012-09-04 ENCOUNTER — Ambulatory Visit (INDEPENDENT_AMBULATORY_CARE_PROVIDER_SITE_OTHER): Payer: BC Managed Care – PPO | Admitting: Family Medicine

## 2012-09-04 ENCOUNTER — Encounter: Payer: Self-pay | Admitting: Family Medicine

## 2012-09-04 ENCOUNTER — Telehealth: Payer: Self-pay | Admitting: Family Medicine

## 2012-09-04 VITALS — BP 108/68 | HR 63 | Temp 98.9°F | Ht 68.25 in | Wt 140.8 lb

## 2012-09-04 DIAGNOSIS — Z0000 Encounter for general adult medical examination without abnormal findings: Secondary | ICD-10-CM

## 2012-09-04 DIAGNOSIS — Z1211 Encounter for screening for malignant neoplasm of colon: Secondary | ICD-10-CM

## 2012-09-04 DIAGNOSIS — Z125 Encounter for screening for malignant neoplasm of prostate: Secondary | ICD-10-CM

## 2012-09-04 NOTE — Progress Notes (Signed)
Subjective:    Patient ID: Patrick Mcpherson, male    DOB: 1960-08-08, 52 y.o.   MRN: [id number]  HPI Here for health maintenance exam and to review chronic medical problems    Wt is down 20 lb He started working out - training for [employer] - planned this weekend -he is excited about it  bmi is 21 He thinks he is too light now- and knows he needs to eat more  Will work on increasing his calories  Lots of protein - needs to increase carbohydrates  New goal will be mt of fitness level   Did sprain ankle in his training - running a trail   Colon cancer screen- is not interested  Nl ifob last year  Flu vaccine- thinks he missed it last fall -plans to get one this fall  Td 12/04- due for that - he is worried about getting it before his event   Prostate ca screen Some fam hx Lab Results  Component Value Date   PSA 0.32 08/21/2012   PSA 0.34 09/29/2011   PSA 0.31 11/24/2010   no prostate symptoms , no nocturia or voiding issues    Lab Results  Component Value Date   CHOL 159 08/21/2012   CHOL 186 09/29/2011   CHOL 201* 11/24/2010   Lab Results  Component Value Date   HDL 72.10 08/21/2012   HDL 30.86 09/29/2011   HDL 57.84 11/24/2010   Lab Results  Component Value Date   LDLCALC 81 08/21/2012   LDLCALC 116* 09/29/2011   Lab Results  Component Value Date   TRIG 31.0 08/21/2012   TRIG 47.0 09/29/2011   TRIG 71.0 11/24/2010   Lab Results  Component Value Date   CHOLHDL 2 08/21/2012   CHOLHDL 3 09/29/2011   CHOLHDL 3 11/24/2010   Lab Results  Component Value Date   LDLDIRECT 120.9 11/24/2010   LDLDIRECT 132.0 01/14/2010   this is excellent - lots of exercise and eating well  Proud of this   Patient Active Problem List   Diagnosis Date Noted  . Colon cancer screening 09/29/2011  . Snoring 12/02/2010  . Depression 12/02/2010  . Routine general medical examination at a health care facility 11/23/2010  . Prostate cancer screening 11/23/2010  . ANXIETY, SITUATIONAL  01/21/2010  . TINNITUS 01/21/2010   Past Medical History  Diagnosis Date  . Anxiety     Stress reaction  . Hyperlipidemia     borderline   Past Surgical History  Procedure Laterality Date  . Vasectomy    . Rhinoplasty     History  Substance Use Topics  . Smoking status: Never Smoker   . Smokeless tobacco: Not on file  . Alcohol Use: Yes     Comment: Small amount   Family History  Problem Relation Age of Onset  . Heart disease Mother     CAD, bypass  . Diabetes Mother   . Cancer Father     Lymphoma  . Cancer Other     Prostate CA in older years   Allergies  Allergen Reactions  . Shellfish Allergy     REACTION: dizzy and nauseated   No current outpatient prescriptions on file prior to visit.   No current facility-administered medications on file prior to visit.      Review of Systems Review of Systems  Constitutional: Negative for fever, appetite change, fatigue and unexpected weight change.  Eyes: Negative for pain and visual disturbance.  Respiratory: Negative for cough and shortness of  breath.   Cardiovascular: Negative for cp or palpitations    Gastrointestinal: Negative for nausea, diarrhea and constipation.  Genitourinary: Negative for urgency and frequency.  Skin: Negative for pallor or rash   Neurological: Negative for weakness, light-headedness, numbness and headaches.  Hematological: Negative for adenopathy. Does not bruise/bleed easily.  Psychiatric/Behavioral: Negative for dysphoric mood. The patient is not nervous/anxious.         Objective:   Physical Exam  Constitutional: He appears well-developed and well-nourished. No distress.  Very slim and well appearing  HENT:  Head: Normocephalic and atraumatic.  Right Ear: External ear normal.  Left Ear: External ear normal.  Nose: Nose normal.  Mouth/Throat: Oropharynx is clear and moist.  Eyes: Conjunctivae and EOM are normal. Pupils are equal, round, and reactive to light. Right eye exhibits  no discharge. Left eye exhibits no discharge. No scleral icterus.  Neck: Normal range of motion. Neck supple. No JVD present. Carotid bruit is not present. No thyromegaly present.  Cardiovascular: Normal rate, regular rhythm, normal heart sounds and intact distal pulses.  Exam reveals no gallop.   Pulmonary/Chest: Effort normal and breath sounds normal. No respiratory distress. He has no wheezes.  Abdominal: Soft. Bowel sounds are normal. He exhibits no distension, no abdominal bruit and no mass. There is no tenderness.  Genitourinary: Rectum normal and prostate normal.  Musculoskeletal: He exhibits no edema and no tenderness.  Lymphadenopathy:    He has no cervical adenopathy.  Neurological: He is alert. He has normal reflexes. No cranial nerve deficit. He exhibits normal muscle tone. Coordination normal.  Skin: Skin is warm and dry. No rash noted. No erythema. No pallor.  Psychiatric: He has a normal mood and affect.          Assessment & Plan:

## 2012-09-04 NOTE — Telephone Encounter (Signed)
Form placed in your inbox

## 2012-09-04 NOTE — Assessment & Plan Note (Signed)
psa stable Remote fam hx No symptoms Nl exam

## 2012-09-04 NOTE — Patient Instructions (Addendum)
Please schedule nurse visit next week for Tdap vaccine Please do IFOB stool card for colon cancer screening  Take care of yourself Increase calorie intake for training

## 2012-09-04 NOTE — Assessment & Plan Note (Signed)
Reviewed health habits including diet and exercise and skin cancer prevention Also reviewed health mt list, fam hx and immunizations  Wellness labs reviewed Enc further healthy lifestyle but warned to watch out for too much wt loss with training -he will inc calories

## 2012-09-04 NOTE — Telephone Encounter (Signed)
Heidi Dach dropped off a Physician Result Form for Dr. Milinda Antis to complete. Please fax the completed form to [phone].  Thanks, Revonda Standard

## 2012-09-04 NOTE — Assessment & Plan Note (Signed)
D/w patient re:options for colon cancer screening, including IFOB vs. colonoscopy.  Risks and benefits of both were discussed and patient voiced understanding.  Pt elects for:IFOB card  

## 2012-09-05 NOTE — Telephone Encounter (Signed)
Done and in IN box 

## 2012-09-05 NOTE — Telephone Encounter (Signed)
Form faxed and left voicemail letting pt know form has been faxed

## 2012-09-14 ENCOUNTER — Other Ambulatory Visit (INDEPENDENT_AMBULATORY_CARE_PROVIDER_SITE_OTHER): Payer: BC Managed Care – PPO

## 2012-09-14 DIAGNOSIS — Z1211 Encounter for screening for malignant neoplasm of colon: Secondary | ICD-10-CM

## 2012-09-14 LAB — FECAL OCCULT BLOOD, IMMUNOCHEMICAL: Fecal Occult Bld: NEGATIVE

## 2012-09-15 ENCOUNTER — Encounter: Payer: Self-pay | Admitting: *Deleted

## 2012-10-04 ENCOUNTER — Telehealth: Payer: Self-pay | Admitting: Family Medicine

## 2012-10-04 ENCOUNTER — Ambulatory Visit (INDEPENDENT_AMBULATORY_CARE_PROVIDER_SITE_OTHER): Payer: BC Managed Care – PPO | Admitting: *Deleted

## 2012-10-04 DIAGNOSIS — Z23 Encounter for immunization: Secondary | ICD-10-CM

## 2012-10-04 NOTE — Telephone Encounter (Signed)
Patrick Mcpherson dropped off a wellness form to be filled out. One was filled out before but the date of testing was not on there.

## 2012-10-04 NOTE — Telephone Encounter (Signed)
Form faxed and left voicemail letting pt know form faxed 

## 2012-10-04 NOTE — Telephone Encounter (Signed)
Form placed in your inbox

## 2012-10-04 NOTE — Telephone Encounter (Signed)
Done and in IN box 

## 2013-03-27 ENCOUNTER — Ambulatory Visit: Payer: BC Managed Care – PPO | Admitting: Family Medicine

## 2013-03-28 ENCOUNTER — Ambulatory Visit (INDEPENDENT_AMBULATORY_CARE_PROVIDER_SITE_OTHER): Payer: BC Managed Care – PPO | Admitting: Family Medicine

## 2013-03-28 ENCOUNTER — Encounter: Payer: Self-pay | Admitting: Family Medicine

## 2013-03-28 VITALS — BP 106/64 | HR 64 | Temp 98.3°F | Ht 68.25 in | Wt 144.2 lb

## 2013-03-28 DIAGNOSIS — R0683 Snoring: Secondary | ICD-10-CM

## 2013-03-28 DIAGNOSIS — G4730 Sleep apnea, unspecified: Secondary | ICD-10-CM | POA: Insufficient documentation

## 2013-03-28 DIAGNOSIS — IMO0002 Reserved for concepts with insufficient information to code with codable children: Secondary | ICD-10-CM

## 2013-03-28 DIAGNOSIS — R0989 Other specified symptoms and signs involving the circulatory and respiratory systems: Secondary | ICD-10-CM

## 2013-03-28 DIAGNOSIS — R0609 Other forms of dyspnea: Secondary | ICD-10-CM

## 2013-03-28 DIAGNOSIS — S76219A Strain of adductor muscle, fascia and tendon of unspecified thigh, initial encounter: Secondary | ICD-10-CM

## 2013-03-28 NOTE — Patient Instructions (Signed)
Stop up front for ref to sleep clinic For muscle strain in groin- use warm compresses/ and avoid heavy lifting / and cool it on the ab workout for a while If worse or you notice a bulge please let me know

## 2013-03-28 NOTE — Progress Notes (Signed)
Pre visit review using our clinic review tool, if applicable. No additional management support is needed unless otherwise documented below in the visit note. 

## 2013-03-28 NOTE — Progress Notes (Signed)
Subjective:    Patient ID: Patrick Mcpherson, male    DOB: 25-Nov-1960, 53 y.o.   MRN: [id number]  HPI Here to discuss sleep apnea and also abdominal pain/strain  Wife told him to get tested for sleep apnea  Snoring loudly most nights Deep breathing/ gasping at times Does talk in sleep- occ jerking movements  This makes him irritable - he is unsure if he gets enough sleep (when he gets up at 4-5 for work)-needs a nap   Unsure how rested he is after a night of sleep - sometimes sleepy during the day  Schedule is problematic  Over the years-sleep has worsened   Generally worse if he gains weight  Now he is slim and runs races   Is interested in ref to sleep clinic   Also has a pulled muscle in his abdomen - Patrick likely a hernia  ? Groin  No bulge or reducible mass  Feels tension to bear down or sneeze or cough  Does not lift weights often/ light  Does abd work outs  No constipation or straining  Never had a hernia before   Patient Active Problem List   Diagnosis Date Noted  . Unspecified sleep apnea 03/28/2013  . Colon cancer screening 09/29/2011  . Snoring 12/02/2010  . Depression 12/02/2010  . Routine general medical examination at a health care facility 11/23/2010  . Prostate cancer screening 11/23/2010  . ANXIETY, SITUATIONAL 01/21/2010  . TINNITUS 01/21/2010   Past Medical History  Diagnosis Date  . Anxiety     Stress reaction  . Hyperlipidemia     borderline   Past Surgical History  Procedure Laterality Date  . Vasectomy    . Rhinoplasty     History  Substance Use Topics  . Smoking status: Never Smoker   . Smokeless tobacco: Not on file  . Alcohol Use: Yes     Comment: Small amount   Family History  Problem Relation Age of Onset  . Heart disease Mother     CAD, bypass  . Diabetes Mother   . Cancer Father     Lymphoma  . Cancer Other     Prostate CA in older years   Allergies  Allergen Reactions  . Shellfish Allergy     REACTION: dizzy and  nauseated   Current Outpatient Prescriptions on File Prior to Visit  Medication Sig Dispense Refill  . Ascorbic Acid (VITAMIN C) 100 MG tablet Take 100 mg by mouth daily.      Marland Kitchen b complex vitamins tablet Take 1 tablet by mouth daily.      . ferrous sulfate 325 (65 FE) MG tablet Take 325 mg by mouth. Takes rarely (2 a month)      . Flaxseed, Linseed, (FLAX SEEDS PO) Take 1 tablet by mouth daily.      . magnesium 30 MG tablet Take 30 mg by mouth 3 (three) times a week.      . Misc Natural Products (OSTEO BI-FLEX ADV DOUBLE ST PO) Take 1 tablet by mouth 2 (two) times daily.      . Zinc Sulfate (ZINC 15 PO) Take 1 tablet by mouth 3 (three) times a week.       No current facility-administered medications on file prior to visit.    Review of Systems Review of Systems  Constitutional: Negative for fever, appetite change,  and unexpected weight change.pos for fatigue ENT pos for occ nasal congestion   Eyes: Negative for pain and visual disturbance.  Respiratory: Negative for cough and shortness of breath.   Cardiovascular: Negative for cp or palpitations    Gastrointestinal: Negative for nausea, diarrhea and constipation.  Genitourinary: Negative for urgency and frequency.  Skin: Negative for pallor or rash   MSK pos for pain in groin / L lower abd when he contracts muscles  Neurological: Negative for weakness, light-headedness, numbness and headaches.  Hematological: Negative for adenopathy. Does not bruise/bleed easily.  Psychiatric/Behavioral: Negative for dysphoric mood. The patient is not nervous/anxious.         Objective:   Physical Exam  Constitutional: He appears well-developed and well-nourished. No distress.  HENT:  Head: Normocephalic and atraumatic.  Right Ear: External ear normal.  Left Ear: External ear normal.  Nose: Nose normal.  Mouth/Throat: Oropharynx is clear and moist.  Eyes: Conjunctivae and EOM are normal. Pupils are equal, round, and reactive to light. Right  eye exhibits no discharge. Left eye exhibits no discharge. No scleral icterus.  Neck: Normal range of motion. Neck supple. No JVD present. No thyromegaly present.  Cardiovascular: Normal rate, regular rhythm, normal heart sounds and intact distal pulses.  Exam reveals no gallop.   Pulmonary/Chest: Effort normal and breath sounds normal. No respiratory distress. He has no wheezes. He exhibits no tenderness.  Abdominal: Soft. Bowel sounds are normal. He exhibits no distension, no abdominal bruit and no mass. Hernia confirmed negative in the right inguinal area and confirmed negative in the left inguinal area.  Genitourinary: Testes normal and penis normal. Cremasteric reflex is present. Right testis shows no mass, no swelling and no tenderness. Left testis shows no mass, no swelling and no tenderness. No penile tenderness. No discharge found.  No hernia bulge noted   Some tenderness in musculature of L upper groin/lower abd  No skin change or ecchymosis  Musculoskeletal: He exhibits no edema and no tenderness.  Lymphadenopathy:    He has no cervical adenopathy.       Right: No inguinal adenopathy present.       Left: No inguinal adenopathy present.  Neurological: He is alert. He has normal reflexes. No cranial nerve deficit. He exhibits normal muscle tone. Coordination normal.  Skin: Skin is warm and dry. No rash noted. No erythema. No pallor.  Psychiatric: He has a normal mood and affect.          Assessment & Plan:

## 2013-03-29 DIAGNOSIS — S76219A Strain of adductor muscle, fascia and tendon of unspecified thigh, initial encounter: Secondary | ICD-10-CM | POA: Insufficient documentation

## 2013-03-29 NOTE — Assessment & Plan Note (Signed)
Snoring with day time somnolence and witnessed breath holding Ref to sleep clinic for eval Pt is not overwt

## 2013-03-29 NOTE — Assessment & Plan Note (Signed)
L side-worse with contracting muscles  Neg eval for hernia Disc what to watch for  Warm compresses/ stretches and relative rest recommended Update if not starting to improve in a week or if worsening

## 2013-03-29 NOTE — Assessment & Plan Note (Signed)
There is concern for sleep apnea Disc with pt in detail Ref to sleep clinic in pulm for further eval

## 2013-04-24 ENCOUNTER — Ambulatory Visit (INDEPENDENT_AMBULATORY_CARE_PROVIDER_SITE_OTHER): Payer: BC Managed Care – PPO | Admitting: Pulmonary Disease

## 2013-04-24 ENCOUNTER — Encounter: Payer: Self-pay | Admitting: Pulmonary Disease

## 2013-04-24 VITALS — BP 122/78 | HR 55 | Temp 97.7°F | Ht 68.0 in | Wt 145.8 lb

## 2013-04-24 DIAGNOSIS — R0683 Snoring: Secondary | ICD-10-CM

## 2013-04-24 DIAGNOSIS — R0609 Other forms of dyspnea: Secondary | ICD-10-CM

## 2013-04-24 DIAGNOSIS — R0989 Other specified symptoms and signs involving the circulatory and respiratory systems: Secondary | ICD-10-CM

## 2013-04-24 NOTE — Patient Instructions (Signed)
Schedule sleep study

## 2013-04-24 NOTE — Assessment & Plan Note (Addendum)
Given narrow pharyngeal exam & loud snoring, obstructive sleep apnea is very likely & an overnight polysomnogram will be scheduled as a split study. The pathophysiology of obstructive sleep apnea , it's cardiovascular consequences & modes of treatment including CPAP were discused with the patient in detail & they evidenced understanding. OK to use melatonin 5mg  night of study We discussed that due to his occupation he would be helped her much higher standard in terms of treatment should we Find significant sleep disordered breathing

## 2013-04-24 NOTE — Progress Notes (Signed)
Subjective:    Patient ID: Patrick Mcpherson, male    DOB: 1960/03/14, 53 y.o.   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 His wife has noted loud snoring and blowing sounds during sleep , sometimes an erratic breathing rhythm. Epworth sleepiness score is 11/24 and he report sleepiness while laying down to rest in the afternoon, or sitting inactive in a public place. 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 Bedtime is around 10:50 PM, sleep latency is 10-15 minutes, he sleeps on his back with one pillow, increased snoring is noted on his back, he is 2-3 awakenings without bathroom visits and is out of bed as early as 4:30 AM and his latest 7 AM with mild dryness of mouth.  He reports occasional afternoon naps in his recliner for an hour. There is no history suggestive of cataplexy, sleep paralysis or parasomnias   Past Medical History  Diagnosis Date  . Anxiety     Stress reaction  . Hyperlipidemia     borderline   Past Surgical History  Procedure Laterality Date  . Vasectomy    . Rhinoplasty      Allergies  Allergen Reactions  . Shellfish Allergy     REACTION: dizzy and nauseated    History   Social History  . Marital Status: Married    Spouse Name: N/A    Number of Children: 4  . Years of Education: N/A   Occupational History  . Pilot    Social History Main Topics  . Smoking status: Never Smoker   . Smokeless tobacco: Not on file  . Alcohol Use: Yes     Comment: Small amount  . Drug Use: No  . Sexual Activity: Not on file   Other Topics Concern  . Not on file   Social History Narrative   Exercise:  Elliptical / weights / swimming / walking    Family History  Problem Relation Age of Onset  . Heart disease Mother     CAD, bypass  . Diabetes Mother   . Cancer Father     Lymphoma  .  Cancer Other     Prostate CA in older years     Review of Systems  Constitutional: Negative for fever and unexpected weight change.  HENT: Negative for congestion, dental problem, ear pain, nosebleeds, postnasal drip, rhinorrhea, sinus pressure, sneezing, sore throat and trouble swallowing.   Eyes: Negative for redness and itching.  Respiratory: Negative for cough, chest tightness, shortness of breath and wheezing.   Cardiovascular: Negative for palpitations and leg swelling.  Gastrointestinal: Negative for nausea and vomiting.  Genitourinary: Negative for dysuria.  Musculoskeletal: Negative for joint swelling.  Skin: Negative for rash.  Neurological: Negative for headaches.  Hematological: Does not bruise/bleed easily.  Psychiatric/Behavioral: Negative for dysphoric mood. The patient is not nervous/anxious.        Objective:   Physical Exam  Gen. Pleasant, thin male, in no distress, normal affect ENT - no lesions, no post nasal drip, class 1 airway Neck: No JVD, no thyromegaly, no carotid bruits Lungs: no use of accessory muscles, no dullness to percussion, clear without rales or rhonchi  Cardiovascular: Rhythm regular, heart sounds  normal, no murmurs or gallops, no peripheral edema Abdomen: soft and non-tender, no hepatosplenomegaly, BS normal. Musculoskeletal: No deformities, no cyanosis or  clubbing Neuro:  alert, non focal       Assessment & Plan:

## 2013-05-11 ENCOUNTER — Telehealth: Payer: Self-pay | Admitting: Family Medicine

## 2013-05-11 NOTE — Telephone Encounter (Signed)
Dr. Milinda Antisower out of office.  Routed to Dr. Ermalene SearingBedsole.

## 2013-05-11 NOTE — Telephone Encounter (Signed)
Pt needs to be seen in office, sat clinic or at urgent care today or tommorow.

## 2013-05-11 NOTE — Telephone Encounter (Signed)
Pt scheduled for Saturday clinic

## 2013-05-11 NOTE — Telephone Encounter (Signed)
Patient Information:  Caller Name: Amada JupiterDale  Phone: [phone]  Patient: Patrick Mcpherson, Patrick Mcpherson  Gender: Male  DOB: [birth year]  Age: 53 Years  PCP: Roxy Mannsower, [place] [facility](Family Practice)  Office Follow Up:  Does the office need to follow up with this patient?: Yes  Instructions For The Office: Looked for an app[facility]. at all [facility] offices and none available.   Symptoms  Reason For Call & Symptoms: Patient calling with flu like sx.  Started on 4/7 with a scratchy throat with a cough.  The cough became productive on 4/9 with sneezing and a fever.  Today his fever is 101 orally.  Reviewed Health History In EMR: Yes  Reviewed Medications In EMR: Yes  Reviewed Allergies In EMR: Yes  Reviewed Surgeries / Procedures: Yes  Date of Onset of Symptoms: 05/08/2013  Treatments Tried: Nyquil, Motrin  Treatments Tried Worked: No  Any Fever: Yes  Fever Taken: Oral  Fever Time Of Reading: 08:30:00  Fever Last Reading: 101  Guideline(s) Used:  Sinus Pain and Congestion  Disposition Per Guideline:   See Today in Office  Reason For Disposition Reached:   Sinus pain (not just congestion) and fever  Advice Given:  N/A  Patient Will Follow Care Advice:  YES

## 2013-05-12 ENCOUNTER — Encounter: Payer: Self-pay | Admitting: Internal Medicine

## 2013-05-12 ENCOUNTER — Ambulatory Visit (INDEPENDENT_AMBULATORY_CARE_PROVIDER_SITE_OTHER): Payer: BC Managed Care – PPO | Admitting: Internal Medicine

## 2013-05-12 VITALS — BP 130/80 | HR 80 | Temp 98.5°F | Resp 20 | Ht 68.0 in | Wt 146.0 lb

## 2013-05-12 DIAGNOSIS — J069 Acute upper respiratory infection, unspecified: Secondary | ICD-10-CM

## 2013-05-12 MED ORDER — AMOXICILLIN 500 MG PO TABS: 1000.0000 mg | ORAL_TABLET | Freq: Two times a day (BID) | ORAL | Status: DC

## 2013-05-12 NOTE — Progress Notes (Signed)
Subjective:    Patient ID: Patrick Mcpherson, male    DOB: 06-25-1960, 53 y.o.   MRN: [id number]  HPI Started with cough and throat tickle about 4 days ago Low grade fever for the past 2 days (as high as 102) Some sputum with cough--seemed to be from chest (yellow) Sneezing---still slight sore throat Cough with exertion Having headache Clear nasal discharge No ear pain Not really SOB--but thinks he wouldn't be able to push it  Has taken analgesics --ibuprofen Tried nyquil one night  Current Outpatient Prescriptions on File Prior to Visit  Medication Sig Dispense Refill  . Ascorbic Acid (VITAMIN C) 100 MG tablet Take 100 mg by mouth daily.      Marland Kitchen. b complex vitamins tablet Take 1 tablet by mouth daily.      . ferrous sulfate 325 (65 FE) MG tablet Take 325 mg by mouth. Takes rarely (2 a month)      . Flaxseed, Linseed, (FLAX SEEDS PO) Take 1 tablet by mouth daily.      . L-ARGININE PO Take 1 tablet by mouth. Takes 2-3 times daily      . magnesium 30 MG tablet Take 30 mg by mouth 3 (three) times a week.      . Misc Natural Products (OSTEO BI-FLEX ADV DOUBLE ST PO) Take 1 tablet by mouth 2 (two) times daily.      . Zinc Sulfate (ZINC 15 PO) Take 1 tablet by mouth 3 (three) times a week.       No current facility-administered medications on file prior to visit.    Allergies  Allergen Reactions  . Shellfish Allergy     REACTION: dizzy and nauseated    Past Medical History  Diagnosis Date  . Anxiety     Stress reaction  . Hyperlipidemia     borderline    Past Surgical History  Procedure Laterality Date  . Vasectomy    . Rhinoplasty      Family History  Problem Relation Age of Onset  . Heart disease Mother     CAD, bypass  . Diabetes Mother   . Cancer Father     Lymphoma  . Cancer Other     Prostate CA in older years    History   Social History  . Marital Status: Married    Spouse Name: N/A    Number of Children: 4  . Years of Education: N/A    Occupational History  . Pilot    Social History Main Topics  . Smoking status: Never Smoker   . Smokeless tobacco: Not on file  . Alcohol Use: Yes     Comment: Small amount  . Drug Use: No  . Sexual Activity: Not on file   Other Topics Concern  . Not on file   Social History Narrative   Exercise:  Elliptical / weights / swimming / walking   Review of Systems No rash No vomiting or diarrhea Appetite is off    Objective:   Physical Exam  Constitutional: He appears well-developed. No distress.  HENT:  Mouth/Throat: Oropharynx is clear and moist. No oropharyngeal exudate.  No sig sinus tenderness Moderate nasal inflammation  TMs normal  Neck: Normal range of motion. Neck supple.  Pulmonary/Chest: Effort normal and breath sounds normal. No respiratory distress. He has no wheezes. He has no rales.  Lymphadenopathy:    He has no cervical adenopathy.          Assessment & Plan:

## 2013-05-12 NOTE — Progress Notes (Signed)
Pre-visit discussion using our clinic review tool. No additional management support is needed unless otherwise documented below in the visit note.  

## 2013-05-12 NOTE — Assessment & Plan Note (Signed)
Seems to be viral Discussed supportive care  Rx for amoxil in case secondary sinus infection ---discussed signs of this

## 2013-05-27 ENCOUNTER — Ambulatory Visit (HOSPITAL_BASED_OUTPATIENT_CLINIC_OR_DEPARTMENT_OTHER): Payer: BC Managed Care – PPO | Attending: Pulmonary Disease

## 2013-05-27 VITALS — Ht 68.0 in | Wt 145.0 lb

## 2013-05-27 DIAGNOSIS — R0683 Snoring: Secondary | ICD-10-CM

## 2013-05-27 DIAGNOSIS — R0989 Other specified symptoms and signs involving the circulatory and respiratory systems: Principal | ICD-10-CM | POA: Insufficient documentation

## 2013-05-27 DIAGNOSIS — R0609 Other forms of dyspnea: Secondary | ICD-10-CM | POA: Insufficient documentation

## 2013-05-31 ENCOUNTER — Telehealth: Payer: Self-pay | Admitting: Pulmonary Disease

## 2013-05-31 DIAGNOSIS — R0989 Other specified symptoms and signs involving the circulatory and respiratory systems: Secondary | ICD-10-CM

## 2013-05-31 DIAGNOSIS — R0609 Other forms of dyspnea: Secondary | ICD-10-CM

## 2013-05-31 NOTE — Sleep Study (Signed)
[facility]   NAME: Patrick Mcpherson  DATE OF BIRTH: 09-Feb-1960  MEDICAL RECORD [id number]  LOCATION: [facility]   PHYSICIAN: ALVA,RAKESH V.   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 His wife has noted loud snoring and blowing sounds during sleep , sometimes an erratic breathing rhythm  At the time of this study ,they weighed 145 pounds with a height of 5 ft 8 inches and the BMI of 22, neck size of 13 inches. Epworth sleepiness score was 11   This nocturnal polysomnogram was performed with a sleep technologist in attendance. EEG, EOG,EMG and respiratory parameters recorded. Sleep stages, arousals, limb movements and respiratory data was scored according to criteria laid out by the American Academy of sleep medicine.   SLEEP ARCHITECTURE: Lights out was at 2305 PM and lights on was at 526 AM. Total sleep time was 300 minutes with a sleep period time of 364 minutes and a sleep efficiency of 79 %. Sleep latency was 15 minutes with latency to REM sleep of 197 minutes and wake after sleep onset of 64 minutes. . Sleep stages as a percentage of total sleep time was N1 -3 %,N2- 80 % and REM sleep 16 % ( 49 minutes) . The longest period of REM sleep was around 4 AM.   AROUSAL DATA : There were 22  arousals with an arousal index of 4.4 events per hour. Most of these were spontaneous & 3 were associated with respiratory events  RESPIRATORY DATA: There were 4 obstructive apneas, 0 central apneas, 0 mixed apneas and 8 hypopneas with apnea -hypopnea index of 2.4 events per hour. There were 0 RERAs with an RDI of 2.4 events per hour. There was no relation to sleep stage or body position. Supine sleep was noted  MOVEMENT/PARASOMNIA: There were 0 PLMS with a PLM  index of 0 events per hour. The PLM arousal index was 0 per hour.  OXYGEN DATA: The lowest desaturation was 86 % during REM sleep and the desaturation index was 5 per hour.   CARDIAC DATA: The low heart rate was 37 beats per minute. The high heart rate recorded was an artifact. No arrhythmias were noted   DISCUSSION -mild snoring was noted . He did not meet criteria for CPAP intervention. He was desensitized with a medium fullface mask  IMPRESSION :  1. no evidence off obstructive sleep apnea , mild oxygen desaturation was noted.  2. No evidence of cardiac arrhythmias,periodic limb movements or behavioral disturbance during sleep.  3. Sleep efficiency was acceptable  RECOMMENDATION:  1. No treatment is required for this degree of sleep disordered breathing  2. Patient should be cautioned against driving when sleepy  3. They should be asked to avoid medications with sedative side effects    Oretha MilchALVA,RAKESH V. MD Diplomate, American Board of Sleep Medicine    ELECTRONICALLY SIGNED ON: 05/31/2013  [facility]  PH: [phone] FX: [phone]  ACCREDITED BY THE AMERICAN ACADEMY OF SLEEP MEDICINE

## 2013-05-31 NOTE — Telephone Encounter (Signed)
lmomtcb x1 

## 2013-05-31 NOTE — Telephone Encounter (Signed)
No evidence of sleep disordered breathing on PSG

## 2013-06-01 NOTE — Telephone Encounter (Signed)
Pt is returning Mindy's call.  Holly D Pryor ° °

## 2013-06-01 NOTE — Telephone Encounter (Signed)
Spoke with pt. Aware of recs. Sample left of rpick up

## 2013-06-01 NOTE — Telephone Encounter (Signed)
Made pt aware of results.  He wants to know what the next step is? How to cut the snoring out or reduce it?  Please advise Dr. Vassie LollAlva thanks

## 2013-06-01 NOTE — Telephone Encounter (Signed)
lmomtcb x 2  

## 2013-06-01 NOTE — Telephone Encounter (Signed)
Can try nasal steroid spray - nasonex each nare at bedtime Snoring strips of limited benefit

## 2013-08-09 ENCOUNTER — Telehealth: Payer: Self-pay | Admitting: Family Medicine

## 2013-08-09 NOTE — Telephone Encounter (Signed)
Please put him in for a 30 min slot-thanks

## 2013-08-09 NOTE — Telephone Encounter (Signed)
Patient needs a physical done before 10/02/13.  Patient has to have a form filled out with lab work for insurance before 10/02/13. Can patient be seen before September for a physical?  Your first available physical appointment is in December.

## 2013-08-10 ENCOUNTER — Telehealth: Payer: Self-pay | Admitting: Pulmonary Disease

## 2013-08-10 ENCOUNTER — Encounter: Payer: Self-pay | Admitting: *Deleted

## 2013-08-10 NOTE — Telephone Encounter (Signed)
OK to give letter stating -he was evaluated  &  No evidence of sleep disordered breathing on PSG

## 2013-08-10 NOTE — Telephone Encounter (Signed)
Letter has been done Pt wanted this mailed to him. I have done so. Nothing further needed

## 2013-08-10 NOTE — Telephone Encounter (Signed)
I called spoke with pt. He is requesting a letter from Dr. Vassie LollAlva stating he does not have sleep apnea. 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 Please advise Dr. Vassie LollAlva if okay to do so? thanks

## 2013-08-28 ENCOUNTER — Telehealth: Payer: Self-pay | Admitting: Family Medicine

## 2013-08-28 DIAGNOSIS — Z0000 Encounter for general adult medical examination without abnormal findings: Secondary | ICD-10-CM

## 2013-08-28 DIAGNOSIS — Z125 Encounter for screening for malignant neoplasm of prostate: Secondary | ICD-10-CM

## 2013-08-28 NOTE — Telephone Encounter (Signed)
Message copied by Judy Pimple on Tue Aug 28, 2013 10:43 PM ------      Message from: Baldomero Lamy      Created: Wed Aug 22, 2013  3:50 PM      Regarding: Cpx labs 08/29/13 (Wed)       Please order  future cpx labs for pt's upcoming lab appt.      Thanks      Tasha             ------

## 2013-08-29 ENCOUNTER — Other Ambulatory Visit (INDEPENDENT_AMBULATORY_CARE_PROVIDER_SITE_OTHER): Payer: BC Managed Care – PPO

## 2013-08-29 DIAGNOSIS — Z125 Encounter for screening for malignant neoplasm of prostate: Secondary | ICD-10-CM

## 2013-08-29 DIAGNOSIS — Z0000 Encounter for general adult medical examination without abnormal findings: Secondary | ICD-10-CM

## 2013-08-29 LAB — LIPID PANEL
CHOLESTEROL: 177 mg/dL (ref 0–200)
HDL: 75.1 mg/dL (ref >39.00)
LDL CALC: 92 mg/dL (ref 0–99)
NonHDL: 101.9
Total CHOL/HDL Ratio: 2
Triglycerides: 52 mg/dL (ref 0.0–149.0)
VLDL: 10.4 mg/dL (ref 0.0–40.0)

## 2013-08-29 LAB — CBC WITH DIFFERENTIAL/PLATELET
Basophils Absolute: 0 10*3/uL (ref 0.0–0.1)
Basophils Relative: 0.6 % (ref 0.0–3.0)
EOS PCT: 3.7 % (ref 0.0–5.0)
Eosinophils Absolute: 0.2 10*3/uL (ref 0.0–0.7)
HCT: 43.6 % (ref 39.0–52.0)
Hemoglobin: 14.6 g/dL (ref 13.0–17.0)
Lymphocytes Relative: 34.3 % (ref 12.0–46.0)
Lymphs Abs: 1.5 10*3/uL (ref 0.7–4.0)
MCHC: 33.5 g/dL (ref 30.0–36.0)
MCV: 94.6 fl (ref 78.0–100.0)
MONOS PCT: 9.5 % (ref 3.0–12.0)
Monocytes Absolute: 0.4 10*3/uL (ref 0.1–1.0)
NEUTROS PCT: 51.9 % (ref 43.0–77.0)
Neutro Abs: 2.2 10*3/uL (ref 1.4–7.7)
Platelets: 232 10*3/uL (ref 150.0–400.0)
RBC: 4.61 Mil/uL (ref 4.22–5.81)
RDW: 13.4 % (ref 11.5–15.5)
WBC: 4.3 10*3/uL (ref 4.0–10.5)

## 2013-08-29 LAB — COMPREHENSIVE METABOLIC PANEL
ALT: 24 U/L (ref 0–53)
AST: 25 U/L (ref 0–37)
Albumin: 4.1 g/dL (ref 3.5–5.2)
Alkaline Phosphatase: 93 U/L (ref 39–117)
BUN: 20 mg/dL (ref 6–23)
CO2: 29 meq/L (ref 19–32)
Calcium: 9.4 mg/dL (ref 8.4–10.5)
Chloride: 103 mEq/L (ref 96–112)
Creatinine, Ser: 1.1 mg/dL (ref 0.4–1.5)
GFR: 76.37 mL/min (ref >60.00)
GLUCOSE: 96 mg/dL (ref 70–99)
POTASSIUM: 4.5 meq/L (ref 3.5–5.1)
SODIUM: 138 meq/L (ref 135–145)
TOTAL PROTEIN: 6.3 g/dL (ref 6.0–8.3)
Total Bilirubin: 0.9 mg/dL (ref 0.2–1.2)

## 2013-08-29 LAB — PSA: PSA: 0.3 ng/mL (ref 0.10–4.00)

## 2013-08-29 LAB — TSH: TSH: 1.98 u[IU]/mL (ref 0.35–4.50)

## 2013-09-05 ENCOUNTER — Ambulatory Visit (INDEPENDENT_AMBULATORY_CARE_PROVIDER_SITE_OTHER): Payer: BC Managed Care – PPO | Admitting: Family Medicine

## 2013-09-05 ENCOUNTER — Encounter: Payer: Self-pay | Admitting: Family Medicine

## 2013-09-05 VITALS — BP 122/76 | HR 61 | Temp 98.4°F | Ht 68.0 in | Wt 141.8 lb

## 2013-09-05 DIAGNOSIS — Z0000 Encounter for general adult medical examination without abnormal findings: Secondary | ICD-10-CM

## 2013-09-05 DIAGNOSIS — Z1211 Encounter for screening for malignant neoplasm of colon: Secondary | ICD-10-CM

## 2013-09-05 DIAGNOSIS — Z125 Encounter for screening for malignant neoplasm of prostate: Secondary | ICD-10-CM

## 2013-09-05 NOTE — Progress Notes (Signed)
Subjective:    Patient ID: Patrick Mcpherson, male    DOB: [birth year], 53 y.o.   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    Wt is down 4 lb with bmi of 21 Running a lot and in the middle of his race season He makes himself eat regularly    Flu vaccine - did not get one last fall   Td 9/14  colonosc -pt declines IFOb nl 8/14 Not interested in colonoscopy yet  Will do IFOB card again  No stool changes   Prostate hx  Lab Results  Component Value Date   PSA 0.30 08/29/2013   PSA 0.32 08/21/2012   PSA 0.34 09/29/2011   this stays low and stable  No problems or symptoms with prostate  Rarely has nocturia  No flow problems  No prostate cancer in family    Results for orders placed in visit on 08/29/13  CBC WITH DIFFERENTIAL      Result Value Ref Range   WBC 4.3  4.0 - 10.5 K/uL   RBC 4.61  4.22 - 5.81 Mil/uL   Hemoglobin 14.6  13.0 - 17.0 g/dL   HCT 40.9  81.1 - 91.4 %   MCV 94.6  78.0 - 100.0 fl   MCHC 33.5  30.0 - 36.0 g/dL   RDW 78.2  95.6 - 21.3 %   Platelets 232.0  150.0 - 400.0 K/uL   Neutrophils Relative % 51.9  43.0 - 77.0 %   Lymphocytes Relative 34.3  12.0 - 46.0 %   Monocytes Relative 9.5  3.0 - 12.0 %   Eosinophils Relative 3.7  0.0 - 5.0 %   Basophils Relative 0.6  0.0 - 3.0 %   Neutro Abs 2.2  1.4 - 7.7 K/uL   Lymphs Abs 1.5  0.7 - 4.0 K/uL   Monocytes Absolute 0.4  0.1 - 1.0 K/uL   Eosinophils Absolute 0.2  0.0 - 0.7 K/uL   Basophils Absolute 0.0  0.0 - 0.1 K/uL  COMPREHENSIVE METABOLIC PANEL      Result Value Ref Range   Sodium 138  135 - 145 mEq/L   Potassium 4.5  3.5 - 5.1 mEq/L   Chloride 103  96 - 112 mEq/L   CO2 29  19 - 32 mEq/L   Glucose, Bld 96  70 - 99 mg/dL   BUN 20  6 - 23 mg/dL   Creatinine, Ser 1.1  0.4 - 1.5 mg/dL   Total Bilirubin 0.9  0.2 - 1.2  mg/dL   Alkaline Phosphatase 93  39 - 117 U/L   AST 25  0 - 37 U/L   ALT 24  0 - 53 U/L   Total Protein 6.3  6.0 - 8.3 g/dL   Albumin 4.1  3.5 - 5.2 g/dL   Calcium 9.4  8.4 - 08.6 mg/dL   GFR 57.84  >69.62 mL/min  LIPID PANEL      Result Value Ref Range   Cholesterol 177  0 - 200 mg/dL   Triglycerides 95.2  0.0 - 149.0 mg/dL   HDL 84.13  >24.40 mg/dL   VLDL 10.2  0.0 - 72.5 mg/dL   LDL Cholesterol 92  0 - 99 mg/dL   Total CHOL/HDL Ratio 2  NonHDL 101.90    PSA      Result Value Ref Range   PSA 0.30  0.10 - 4.00 ng/mL  TSH      Result Value Ref Range   TSH 1.98  0.35 - 4.50 uIU/mL    Labs are great  Cholesterol stays in a good range   Patient Active Problem List   Diagnosis Date Noted  . Upper respiratory infection 05/12/2013  . Groin strain 03/29/2013  . Unspecified sleep apnea 03/28/2013  . Colon cancer screening 09/29/2011  . Snoring 12/02/2010  . Depression 12/02/2010  . Routine general medical examination at a health care facility 11/23/2010  . Prostate cancer screening 11/23/2010  . ANXIETY, SITUATIONAL 01/21/2010  . TINNITUS 01/21/2010   Past Medical History  Diagnosis Date  . Anxiety     Stress reaction  . Hyperlipidemia     borderline   Past Surgical History  Procedure Laterality Date  . Vasectomy    . Rhinoplasty     History  Substance Use Topics  . Smoking status: Never Smoker   . Smokeless tobacco: Not on file  . Alcohol Use: Yes     Comment: Small amount   Family History  Problem Relation Age of Onset  . Heart disease Mother     CAD, bypass  . Diabetes Mother   . Cancer Father     Lymphoma  . Cancer Other     Prostate CA in older years   Allergies  Allergen Reactions  . Shellfish Allergy     REACTION: dizzy and nauseated   Current Outpatient Prescriptions on File Prior to Visit  Medication Sig Dispense Refill  . Ascorbic Acid (VITAMIN C) 100 MG tablet Take 100 mg by mouth 3 (three) times a week.       Marland Kitchen b complex vitamins  tablet Take 1 tablet by mouth daily.      . ferrous sulfate 325 (65 FE) MG tablet Take 325 mg by mouth. Takes rarely (2 a month)      . magnesium 30 MG tablet Take 30 mg by mouth 3 (three) times a week.      . Misc Natural Products (OSTEO BI-FLEX ADV DOUBLE ST PO) Take 1 tablet by mouth 2 (two) times daily.      . Zinc Sulfate (ZINC 15 PO) Take 1 tablet by mouth 3 (three) times a week.       No current facility-administered medications on file prior to visit.    Review of Systems Review of Systems  Constitutional: Negative for fever, appetite change, fatigue and unexpected weight change.  Eyes: Negative for pain and visual disturbance.  Respiratory: Negative for cough and shortness of breath.   Cardiovascular: Negative for cp or palpitations    Gastrointestinal: Negative for nausea, diarrhea and constipation.  Genitourinary: Negative for urgency and frequency.  Skin: Negative for pallor or rash   MSK pos for leg strain on L on and off -in hip and buttock area  Neurological: Negative for weakness, light-headedness, numbness and headaches.  Hematological: Negative for adenopathy. Does not bruise/bleed easily.  Psychiatric/Behavioral: Negative for dysphoric mood. The patient is not nervous/anxious.         Objective:   Physical Exam  Constitutional: He appears well-developed and well-nourished. No distress.  Slim and well app  HENT:  Head: Normocephalic and atraumatic.  Right Ear: External ear normal.  Left Ear: External ear normal.  Nose: Nose normal.  Mouth/Throat: Oropharynx is clear and moist.  Eyes: Conjunctivae  and EOM are normal. Pupils are equal, round, and reactive to light. Right eye exhibits no discharge. Left eye exhibits no discharge. No scleral icterus.  Neck: Normal range of motion. Neck supple. No JVD present. Carotid bruit is not present. No thyromegaly present.  Cardiovascular: Normal rate, regular rhythm, normal heart sounds and intact distal pulses.  Exam reveals  no gallop.   Pulmonary/Chest: Effort normal and breath sounds normal. No respiratory distress. He has no wheezes. He exhibits no tenderness.  Abdominal: Soft. Bowel sounds are normal. He exhibits no distension, no abdominal bruit and no mass. There is no tenderness.  Musculoskeletal: He exhibits no edema and no tenderness.  Nl rom LEs  Nl rom bilat hips and spine Some discomfort with piriformis stretch on L   Lymphadenopathy:    He has no cervical adenopathy.  Neurological: He is alert. He has normal reflexes. No cranial nerve deficit. He exhibits normal muscle tone. Coordination normal.  Skin: Skin is warm and dry. No rash noted. No erythema. No pallor.  lentigos and one sk on back Tanned   Some healing insect bites on ankles and lower legs   Psychiatric: He has a normal mood and affect.          Assessment & Plan:   Problem List Items Addressed This Visit     Other   Routine general medical examination at a health care facility     Here for health maintenance exam and to review chronic medical problems   Labs reviewed  Enc to get a flu vaccine this fall IFOB card for colon screen Disc athletic safety  Disc nutrition and wt mt     Prostate cancer screening      Lab Results  Component Value Date   PSA 0.30 08/29/2013   PSA 0.32 08/21/2012   PSA 0.34 09/29/2011   No symptoms  No family hx  Will def DRE today    Colon cancer screening - Primary     D/w patient [id number]:options for colon cancer screening, including IFOB vs. colonoscopy.  Risks and benefits of both were discussed and patient voiced understanding.  Pt elects for: ifob     Relevant Orders      Fecal occult blood, imunochemical

## 2013-09-05 NOTE — Assessment & Plan Note (Signed)
D/w patient re:options for colon cancer screening, including IFOB vs. colonoscopy.  Risks and benefits of both were discussed and patient voiced understanding.  Pt elects for: ifob.    

## 2013-09-05 NOTE — Assessment & Plan Note (Signed)
Lab Results  Component Value Date   PSA 0.30 08/29/2013   PSA 0.32 08/21/2012   PSA 0.34 09/29/2011   No symptoms  No family hx  Will def DRE today

## 2013-09-05 NOTE — Progress Notes (Signed)
Pre visit review using our clinic review tool, if applicable. No additional management support is needed unless otherwise documented below in the visit note. 

## 2013-09-05 NOTE — Patient Instructions (Signed)
Keep stretching before (warm up) and after exercise to avoid injury  Also stay hydrated  Labs look good  Please do stool card for colon cancer screening

## 2013-09-05 NOTE — Assessment & Plan Note (Signed)
Here for health maintenance exam and to review chronic medical problems   Labs reviewed  Enc to get a flu vaccine this fall IFOB card for colon screen Disc athletic safety  Disc nutrition and wt mt

## 2013-09-18 ENCOUNTER — Other Ambulatory Visit (INDEPENDENT_AMBULATORY_CARE_PROVIDER_SITE_OTHER): Payer: BC Managed Care – PPO

## 2013-09-18 DIAGNOSIS — Z1211 Encounter for screening for malignant neoplasm of colon: Secondary | ICD-10-CM

## 2013-09-18 LAB — FECAL OCCULT BLOOD, IMMUNOCHEMICAL: FECAL OCCULT BLD: NEGATIVE

## 2014-06-09 ENCOUNTER — Telehealth: Payer: Self-pay | Admitting: Family Medicine

## 2014-06-09 DIAGNOSIS — Z0000 Encounter for general adult medical examination without abnormal findings: Secondary | ICD-10-CM

## 2014-06-09 DIAGNOSIS — Z125 Encounter for screening for malignant neoplasm of prostate: Secondary | ICD-10-CM

## 2014-06-09 NOTE — Telephone Encounter (Signed)
-----   Message from Baldomero LamyNatasha C Chavers sent at 06/04/2014  2:02 PM EDT ----- Regarding: Cpx labs Mon 5/9, need orders please. :-) Please order  future cpx labs for pt's upcoming lab appt. Thanks Rodney Boozeasha

## 2014-06-10 ENCOUNTER — Other Ambulatory Visit (INDEPENDENT_AMBULATORY_CARE_PROVIDER_SITE_OTHER): Payer: BLUE CROSS/BLUE SHIELD

## 2014-06-10 DIAGNOSIS — Z0000 Encounter for general adult medical examination without abnormal findings: Secondary | ICD-10-CM | POA: Diagnosis not present

## 2014-06-10 DIAGNOSIS — Z125 Encounter for screening for malignant neoplasm of prostate: Secondary | ICD-10-CM | POA: Diagnosis not present

## 2014-06-10 LAB — CBC WITH DIFFERENTIAL/PLATELET
BASOS ABS: 0 10*3/uL (ref 0.0–0.1)
Basophils Relative: 0.6 % (ref 0.0–3.0)
EOS ABS: 0.2 10*3/uL (ref 0.0–0.7)
Eosinophils Relative: 4.1 % (ref 0.0–5.0)
HCT: 42.8 % (ref 39.0–52.0)
Hemoglobin: 14.5 g/dL (ref 13.0–17.0)
LYMPHS PCT: 30.7 % (ref 12.0–46.0)
Lymphs Abs: 1.5 10*3/uL (ref 0.7–4.0)
MCHC: 33.9 g/dL (ref 30.0–36.0)
MCV: 92.2 fl (ref 78.0–100.0)
MONO ABS: 0.5 10*3/uL (ref 0.1–1.0)
Monocytes Relative: 10.1 % (ref 3.0–12.0)
NEUTROS PCT: 54.5 % (ref 43.0–77.0)
Neutro Abs: 2.7 10*3/uL (ref 1.4–7.7)
PLATELETS: 257 10*3/uL (ref 150.0–400.0)
RBC: 4.64 Mil/uL (ref 4.22–5.81)
RDW: 13.7 % (ref 11.5–15.5)
WBC: 4.9 10*3/uL (ref 4.0–10.5)

## 2014-06-10 LAB — COMPREHENSIVE METABOLIC PANEL
ALBUMIN: 4 g/dL (ref 3.5–5.2)
ALT: 17 U/L (ref 0–53)
AST: 23 U/L (ref 0–37)
Alkaline Phosphatase: 119 U/L — ABNORMAL HIGH (ref 39–117)
BILIRUBIN TOTAL: 0.6 mg/dL (ref 0.2–1.2)
BUN: 20 mg/dL (ref 6–23)
CO2: 32 mEq/L (ref 19–32)
Calcium: 9.4 mg/dL (ref 8.4–10.5)
Chloride: 103 mEq/L (ref 96–112)
Creatinine, Ser: 0.94 mg/dL (ref 0.40–1.50)
GFR: 88.43 mL/min (ref >60.00)
GLUCOSE: 89 mg/dL (ref 70–99)
Potassium: 4.3 mEq/L (ref 3.5–5.1)
Sodium: 139 mEq/L (ref 135–145)
TOTAL PROTEIN: 6.4 g/dL (ref 6.0–8.3)

## 2014-06-10 LAB — TSH: TSH: 1.78 u[IU]/mL (ref 0.35–4.50)

## 2014-06-10 LAB — LIPID PANEL
CHOL/HDL RATIO: 2
Cholesterol: 164 mg/dL (ref 0–200)
HDL: 72.1 mg/dL (ref >39.00)
LDL CALC: 81 mg/dL (ref 0–99)
NonHDL: 91.9
TRIGLYCERIDES: 55 mg/dL (ref 0.0–149.0)
VLDL: 11 mg/dL (ref 0.0–40.0)

## 2014-06-10 LAB — PSA: PSA: 0.31 ng/mL (ref 0.10–4.00)

## 2014-06-17 ENCOUNTER — Ambulatory Visit (INDEPENDENT_AMBULATORY_CARE_PROVIDER_SITE_OTHER): Payer: BLUE CROSS/BLUE SHIELD | Admitting: Family Medicine

## 2014-06-17 ENCOUNTER — Encounter: Payer: Self-pay | Admitting: Family Medicine

## 2014-06-17 VITALS — BP 104/68 | HR 54 | Temp 97.6°F | Ht 68.75 in | Wt 151.0 lb

## 2014-06-17 DIAGNOSIS — Z0000 Encounter for general adult medical examination without abnormal findings: Secondary | ICD-10-CM | POA: Diagnosis not present

## 2014-06-17 DIAGNOSIS — Z125 Encounter for screening for malignant neoplasm of prostate: Secondary | ICD-10-CM

## 2014-06-17 DIAGNOSIS — Z1211 Encounter for screening for malignant neoplasm of colon: Secondary | ICD-10-CM | POA: Diagnosis not present

## 2014-06-17 DIAGNOSIS — M545 Low back pain: Secondary | ICD-10-CM | POA: Insufficient documentation

## 2014-06-17 DIAGNOSIS — M5416 Radiculopathy, lumbar region: Secondary | ICD-10-CM | POA: Insufficient documentation

## 2014-06-17 NOTE — Progress Notes (Signed)
Pre visit review using our clinic review tool, if applicable. No additional management support is needed unless otherwise documented below in the visit note. 

## 2014-06-17 NOTE — Assessment & Plan Note (Signed)
Lab Results  Component Value Date   PSA 0.31 06/10/2014   PSA 0.30 08/29/2013   PSA 0.32 08/21/2012    No fam hx  No symptoms

## 2014-06-17 NOTE — Assessment & Plan Note (Signed)
D/w patient [id number]:options for colon cancer screening, including IFOB vs. colonoscopy.  Risks and benefits of both were discussed and patient voiced understanding.  Pt elects for: IFOB card Not ready for colonoscopy yet

## 2014-06-17 NOTE — Assessment & Plan Note (Signed)
Reviewed health habits including diet and exercise and skin cancer prevention Reviewed appropriate screening tests for age  Also reviewed health mt list, fam hx and immunization status , as well as social and family history   See HPI Labs reviewed Recommend yearly flu shot Disc athletic safety

## 2014-06-17 NOTE — Assessment & Plan Note (Signed)
Recurrent  Worse lately  Under chiropractic care Wearing back brace today No neuro symptoms  Will return thurs for xr

## 2014-06-17 NOTE — Progress Notes (Signed)
Subjective:    Patient ID: Patrick Mcpherson, male    DOB: [birth year], 54 y.o.   MRN: [id number]  HPI Here for health maintenance exam and to review chronic medical problems    Wearing a back brace today - back "went out"  5 mile run and swim and then sat on a bad chair, also was painting  Has been going to the chiropractor - was getting some better  Is interested in x ray later   Otherwise doing well - getting back into exercise and lighter year for triathalons  Does short races   Wt is up 10 lb with bmi of 22  Hep C/HIV screen -does not think he is high risk   Colon cancer screening : IFOB 8/14 nl  Still not interested in a colonoscopy yet  Will do IFOB   Flu shot- did not get   Td 9/14 - up to date   Glucose was good at 89 He was fasting - ate at 8:30 the night before - nothing special  Has changed diet a bit -cut out some wheat and some dairy lately  Not a sweet eater  Mother was a diabetic    Cholesterol Lab Results  Component Value Date   CHOL 164 06/10/2014   CHOL 177 08/29/2013   CHOL 159 08/21/2012   Lab Results  Component Value Date   HDL 72.10 06/10/2014   HDL 75.10 08/29/2013   HDL 72.10 08/21/2012   Lab Results  Component Value Date   LDLCALC 81 06/10/2014   LDLCALC 92 08/29/2013   LDLCALC 81 08/21/2012   Lab Results  Component Value Date   TRIG 55.0 06/10/2014   TRIG 52.0 08/29/2013   TRIG 31.0 08/21/2012   Lab Results  Component Value Date   CHOLHDL 2 06/10/2014   CHOLHDL 2 08/29/2013   CHOLHDL 2 08/21/2012   Lab Results  Component Value Date   LDLDIRECT 120.9 11/24/2010   LDLDIRECT 132.0 01/14/2010  excellent panel -good habits   Prostate cancer screen Lab Results  Component Value Date   PSA 0.31 06/10/2014   PSA 0.30 08/29/2013   PSA 0.32 08/21/2012   no trouble with flow  No nocturia  No prostate cancer in close relatives      Chemistry      Component Value Date/Time   NA 139 06/10/2014 0841   K 4.3 06/10/2014  0841   CL 103 06/10/2014 0841   CO2 32 06/10/2014 0841   BUN 20 06/10/2014 0841   CREATININE 0.94 06/10/2014 0841      Component Value Date/Time   CALCIUM 9.4 06/10/2014 0841   ALKPHOS 119* 06/10/2014 0841   AST 23 06/10/2014 0841   ALT 17 06/10/2014 0841   BILITOT 0.6 06/10/2014 0841      Lab Results  Component Value Date   WBC 4.9 06/10/2014   HGB 14.5 06/10/2014   HCT 42.8 06/10/2014   MCV 92.2 06/10/2014   PLT 257.0 06/10/2014    Lab Results  Component Value Date   TSH 1.78 06/10/2014      Review of Systems Review of Systems  Constitutional: Negative for fever, appetite change, fatigue and unexpected weight change.  Eyes: Negative for pain and visual disturbance.  Respiratory: Negative for cough and shortness of breath.   Cardiovascular: Negative for cp or palpitations    Gastrointestinal: Negative for nausea, diarrhea and constipation.  Genitourinary: Negative for urgency and frequency.  Skin: Negative for pallor or rash   MSK pos  for recent low back pain/ neg for joint swelling  Neurological: Negative for weakness, light-headedness, numbness and headaches.  Hematological: Negative for adenopathy. Does not bruise/bleed easily.  Psychiatric/Behavioral: Negative for dysphoric mood. The patient is not nervous/anxious.         Objective:   Physical Exam  Constitutional: He appears well-developed and well-nourished. No distress.  Slim and well appearing   HENT:  Head: Normocephalic and atraumatic.  Right Ear: External ear normal.  Left Ear: External ear normal.  Nose: Nose normal.  Mouth/Throat: Oropharynx is clear and moist.  Eyes: Conjunctivae and EOM are normal. Pupils are equal, round, and reactive to light. Right eye exhibits no discharge. Left eye exhibits no discharge. No scleral icterus.  Neck: Normal range of motion. Neck supple. No JVD present. Carotid bruit is not present. No thyromegaly present.  Cardiovascular: Normal rate, regular rhythm, normal  heart sounds and intact distal pulses.  Exam reveals no gallop.   Pulmonary/Chest: Effort normal and breath sounds normal. No respiratory distress. He has no wheezes. He exhibits no tenderness.  Abdominal: Soft. Bowel sounds are normal. He exhibits no distension, no abdominal bruit and no mass. There is no tenderness.  Musculoskeletal: He exhibits no edema or tenderness.  Mild lumbar muscular tenderness bilaterally with loss of lordosis  Neg SLR  Lymphadenopathy:    He has no cervical adenopathy.  Neurological: He is alert. He has normal reflexes. No cranial nerve deficit. He exhibits normal muscle tone. Coordination normal.  Skin: Skin is warm and dry. No rash noted. No erythema. No pallor.  Some lentigo and stable brown nevi on back and trunk  Psychiatric: He has a normal mood and affect.  Nursing note and vitals reviewed.         Assessment & Plan:   Problem List Items Addressed This Visit    Colon cancer screening    D/w patient [id number]:options for colon cancer screening, including IFOB vs. colonoscopy.  Risks and benefits of both were discussed and patient voiced understanding.  Pt elects for: IFOB card Not ready for colonoscopy yet        Relevant Orders   Fecal occult blood, imunochemical   Prostate cancer screening    Lab Results  Component Value Date   PSA 0.31 06/10/2014   PSA 0.30 08/29/2013   PSA 0.32 08/21/2012    No fam hx  No symptoms       Routine general medical examination at a health care facility - Primary    Reviewed health habits including diet and exercise and skin cancer prevention Reviewed appropriate screening tests for age  Also reviewed health mt list, fam hx and immunization status , as well as social and family history   See HPI Labs reviewed Recommend yearly flu shot Disc athletic safety

## 2014-06-17 NOTE — Patient Instructions (Signed)
Take care of yourself  Let the staff up front know you will come back for an xray on Thursday  Keep eating well and exercising  Continue chiropractic care   Do IFOb card for colon cancer screening

## 2014-06-18 ENCOUNTER — Telehealth: Payer: Self-pay | Admitting: Family Medicine

## 2014-06-18 DIAGNOSIS — M545 Low back pain, unspecified: Secondary | ICD-10-CM

## 2014-06-18 NOTE — Telephone Encounter (Signed)
Pt is coming in Thursday for LS films for low back pain

## 2014-06-20 ENCOUNTER — Ambulatory Visit (INDEPENDENT_AMBULATORY_CARE_PROVIDER_SITE_OTHER)
Admission: RE | Admit: 2014-06-20 | Discharge: 2014-06-20 | Disposition: A | Discharge status: Home / Self Care | Payer: BLUE CROSS/BLUE SHIELD | Source: Ambulatory Visit | Attending: Family Medicine | Admitting: Family Medicine

## 2014-06-20 DIAGNOSIS — M545 Low back pain, unspecified: Secondary | ICD-10-CM

## 2014-06-24 ENCOUNTER — Telehealth: Payer: Self-pay | Admitting: Family Medicine

## 2014-06-24 NOTE — Telephone Encounter (Signed)
Pt returned your call. Please call back at mobile number thanks.

## 2014-06-24 NOTE — Telephone Encounter (Signed)
Patient returned Shapale's call. °

## 2014-07-03 ENCOUNTER — Other Ambulatory Visit (INDEPENDENT_AMBULATORY_CARE_PROVIDER_SITE_OTHER): Payer: BLUE CROSS/BLUE SHIELD

## 2014-07-03 DIAGNOSIS — Z1211 Encounter for screening for malignant neoplasm of colon: Secondary | ICD-10-CM

## 2014-07-04 LAB — FECAL OCCULT BLOOD, IMMUNOCHEMICAL: Fecal Occult Bld: NEGATIVE

## 2015-03-06 ENCOUNTER — Telehealth: Payer: Self-pay

## 2015-03-06 NOTE — Telephone Encounter (Signed)
Pt saw eye doctor and was given written lab order for testing; pt has retinal nerve problem;pt will ck to see if eye doctor is [facility] affiliated and if so will cb to schedule lab appt at [facility] and if not pt will go to [facility] drawing station at [place] [employer]. Pt will cb if needed.

## 2015-04-09 ENCOUNTER — Telehealth: Payer: Self-pay | Admitting: Family Medicine

## 2015-04-09 NOTE — Telephone Encounter (Signed)
Pt notified of Dr. Tower's comments and verbalized understanding  

## 2015-04-09 NOTE — Telephone Encounter (Signed)
Pt called back, please call [phone] Thanks

## 2015-04-09 NOTE — Telephone Encounter (Signed)
Pt dropped off labs that were done at [facility].  Placing on cart.  Please let pt know if this lab will suffice as cpe labs and he will cancel labs appt in may  Thank you

## 2015-04-09 NOTE — Telephone Encounter (Signed)
Left voicemail requesting pt to call office back 

## 2015-04-09 NOTE — Telephone Encounter (Signed)
I still need a TSH , cmet and psa unfortunately  Those were not done for [facility]

## 2015-06-07 ENCOUNTER — Telehealth: Payer: Self-pay | Admitting: Family Medicine

## 2015-06-07 DIAGNOSIS — Z0000 Encounter for general adult medical examination without abnormal findings: Secondary | ICD-10-CM

## 2015-06-07 DIAGNOSIS — Z125 Encounter for screening for malignant neoplasm of prostate: Secondary | ICD-10-CM

## 2015-06-07 NOTE — Telephone Encounter (Signed)
-----   Message from Alvina Chouerri J Walsh sent at 06/05/2015  3:58 PM EDT ----- Regarding: Lab orders for Thursday, 5.11.17 Patient is scheduled for CPX labs, please order future labs, Thanks , Camelia Engerri

## 2015-06-12 ENCOUNTER — Other Ambulatory Visit: Payer: BLUE CROSS/BLUE SHIELD

## 2015-06-18 ENCOUNTER — Encounter: Payer: BLUE CROSS/BLUE SHIELD | Admitting: Family Medicine

## 2015-07-09 ENCOUNTER — Other Ambulatory Visit (INDEPENDENT_AMBULATORY_CARE_PROVIDER_SITE_OTHER): Payer: BLUE CROSS/BLUE SHIELD

## 2015-07-09 DIAGNOSIS — Z0000 Encounter for general adult medical examination without abnormal findings: Secondary | ICD-10-CM | POA: Diagnosis not present

## 2015-07-09 DIAGNOSIS — Z125 Encounter for screening for malignant neoplasm of prostate: Secondary | ICD-10-CM | POA: Diagnosis not present

## 2015-07-09 LAB — CBC WITH DIFFERENTIAL/PLATELET
BASOS ABS: 0 10*3/uL (ref 0.0–0.1)
BASOS PCT: 0.6 % (ref 0.0–3.0)
Eosinophils Absolute: 0.2 10*3/uL (ref 0.0–0.7)
Eosinophils Relative: 3.9 % (ref 0.0–5.0)
HCT: 42.3 % (ref 39.0–52.0)
Hemoglobin: 14.2 g/dL (ref 13.0–17.0)
LYMPHS ABS: 1.4 10*3/uL (ref 0.7–4.0)
LYMPHS PCT: 30.2 % (ref 12.0–46.0)
MCHC: 33.6 g/dL (ref 30.0–36.0)
MCV: 91.9 fl (ref 78.0–100.0)
MONOS PCT: 10 % (ref 3.0–12.0)
Monocytes Absolute: 0.5 10*3/uL (ref 0.1–1.0)
NEUTROS ABS: 2.6 10*3/uL (ref 1.4–7.7)
NEUTROS PCT: 55.3 % (ref 43.0–77.0)
PLATELETS: 248 10*3/uL (ref 150.0–400.0)
RBC: 4.61 Mil/uL (ref 4.22–5.81)
RDW: 14.8 % (ref 11.5–15.5)
WBC: 4.6 10*3/uL (ref 4.0–10.5)

## 2015-07-09 LAB — COMPREHENSIVE METABOLIC PANEL
ALT: 15 U/L (ref 0–53)
AST: 19 U/L (ref 0–37)
Albumin: 4 g/dL (ref 3.5–5.2)
Alkaline Phosphatase: 91 U/L (ref 39–117)
BILIRUBIN TOTAL: 1 mg/dL (ref 0.2–1.2)
BUN: 21 mg/dL (ref 6–23)
CALCIUM: 9 mg/dL (ref 8.4–10.5)
CHLORIDE: 104 meq/L (ref 96–112)
CO2: 30 meq/L (ref 19–32)
Creatinine, Ser: 0.95 mg/dL (ref 0.40–1.50)
GFR: 87.01 mL/min (ref >60.00)
GLUCOSE: 94 mg/dL (ref 70–99)
POTASSIUM: 3.9 meq/L (ref 3.5–5.1)
Sodium: 139 mEq/L (ref 135–145)
Total Protein: 6.2 g/dL (ref 6.0–8.3)

## 2015-07-09 LAB — LIPID PANEL
CHOLESTEROL: 172 mg/dL (ref 0–200)
HDL: 73.7 mg/dL (ref >39.00)
LDL CALC: 89 mg/dL (ref 0–99)
NonHDL: 98.13
TRIGLYCERIDES: 46 mg/dL (ref 0.0–149.0)
Total CHOL/HDL Ratio: 2
VLDL: 9.2 mg/dL (ref 0.0–40.0)

## 2015-07-09 LAB — TSH: TSH: 1.52 u[IU]/mL (ref 0.35–4.50)

## 2015-07-09 LAB — PSA: PSA: 0.27 ng/mL (ref 0.10–4.00)

## 2015-07-15 ENCOUNTER — Encounter: Payer: Self-pay | Admitting: Family Medicine

## 2015-07-15 ENCOUNTER — Ambulatory Visit (INDEPENDENT_AMBULATORY_CARE_PROVIDER_SITE_OTHER): Payer: BLUE CROSS/BLUE SHIELD | Admitting: Family Medicine

## 2015-07-15 VITALS — BP 104/68 | HR 54 | Temp 98.0°F | Ht 68.0 in | Wt 154.0 lb

## 2015-07-15 DIAGNOSIS — Z0000 Encounter for general adult medical examination without abnormal findings: Secondary | ICD-10-CM | POA: Diagnosis not present

## 2015-07-15 DIAGNOSIS — Z125 Encounter for screening for malignant neoplasm of prostate: Secondary | ICD-10-CM | POA: Diagnosis not present

## 2015-07-15 DIAGNOSIS — Z1211 Encounter for screening for malignant neoplasm of colon: Secondary | ICD-10-CM | POA: Diagnosis not present

## 2015-07-15 NOTE — Assessment & Plan Note (Signed)
ifob given Declines colonoscopy Given info on cologuard for the future if his insurance covers it

## 2015-07-15 NOTE — Progress Notes (Signed)
Pre visit review using our clinic review tool, if applicable. No additional management support is needed unless otherwise documented below in the visit note. 

## 2015-07-15 NOTE — Assessment & Plan Note (Signed)
Lab Results  Component Value Date   PSA 0.27 07/09/2015   PSA 0.31 06/10/2014   PSA 0.30 08/29/2013   No symptoms No family hx  Will continue to follow

## 2015-07-15 NOTE — Patient Instructions (Signed)
Glad you are doing well  Do the stool kit for colon cancer screening  See if cologuard is covered for next year  If you want to do a colonoscopy in the future let us know  I will fax your forms  Take care of yourself

## 2015-07-15 NOTE — Assessment & Plan Note (Signed)
Reviewed health habits including diet and exercise and skin cancer prevention Reviewed appropriate screening tests for age  Also reviewed health mt list, fam hx and immunization status , as well as social and family history   See HPI Labs reviewed  IFOB for colon screen (declines colonosc) Given info on cologuard as well-will see if that is covered for next annual exam  Glad you are doing well  Do the stool kit for colon cancer screening  See if cologuard is covered for next year  If you want to do a colonoscopy in the future let us know  I will fax your forms  Take care of yourself

## 2015-07-15 NOTE — Progress Notes (Signed)
Subjective:    Patient ID: Patrick Mcpherson, male    DOB: [birth year], 55 y.o.   MRN: [id number]  HPI .Here for health maintenance exam and to review chronic medical problems  Has been feeling good for the most part   Taking pretty good care of himself  He is still into triathalon training (a little less due to lack of time)  Has one upcoming at [facility] in 2 weeks    Drinks a little more wine lately - a bottle a month at most     Wt is up 3 lb with bmi of 23  Hep C screen, HIV screen Decided to pass on that due to low risk   Colon cancer screening - has not had a colonoscopy  Wants to do a stool kit instead  Had ifob kit 6/16-negative Given info on cologuard    Flu shot-does not get   Td 9/14 - is up to date   Prostate health No symptoms  Empties bladder before bed and no nocturia  No slow stream  Good fluid intake  Lab Results  Component Value Date   PSA 0.27 07/09/2015   PSA 0.31 06/10/2014   PSA 0.30 08/29/2013   no prostate cancer in the family    Results for orders placed or performed in visit on 07/09/15  CBC with Differential/Platelet  Result Value Ref Range   WBC 4.6 4.0 - 10.5 K/uL   RBC 4.61 4.22 - 5.81 Mil/uL   Hemoglobin 14.2 13.0 - 17.0 g/dL   HCT 42.3 39.0 - 52.0 %   MCV 91.9 78.0 - 100.0 fl   MCHC 33.6 30.0 - 36.0 g/dL   RDW 14.8 11.5 - 15.5 %   Platelets 248.0 150.0 - 400.0 K/uL   Neutrophils Relative % 55.3 43.0 - 77.0 %   Lymphocytes Relative 30.2 12.0 - 46.0 %   Monocytes Relative 10.0 3.0 - 12.0 %   Eosinophils Relative 3.9 0.0 - 5.0 %   Basophils Relative 0.6 0.0 - 3.0 %   Neutro Abs 2.6 1.4 - 7.7 K/uL   Lymphs Abs 1.4 0.7 - 4.0 K/uL   Monocytes Absolute 0.5 0.1 - 1.0 K/uL   Eosinophils Absolute 0.2 0.0 - 0.7 K/uL   Basophils Absolute 0.0 0.0 - 0.1 K/uL  Comprehensive metabolic panel  Result Value Ref Range   Sodium 139 135 - 145 mEq/L   Potassium 3.9 3.5 - 5.1 mEq/L   Chloride 104 96 - 112 mEq/L   CO2 30 19 - 32 mEq/L   Glucose, Bld 94 70 - 99 mg/dL   BUN 21 6 - 23 mg/dL   Creatinine, Ser 0.95 0.40 - 1.50 mg/dL   Total Bilirubin 1.0 0.2 - 1.2 mg/dL   Alkaline Phosphatase 91 39 - 117 U/L   AST 19 0 - 37 U/L   ALT 15 0 - 53 U/L   Total Protein 6.2 6.0 - 8.3 g/dL   Albumin 4.0 3.5 - 5.2 g/dL   Calcium 9.0 8.4 - 10.5 mg/dL   GFR 87.01 >60.00 mL/min  TSH  Result Value Ref Range   TSH 1.52 0.35 - 4.50 uIU/mL  PSA  Result Value Ref Range   PSA 0.27 0.10 - 4.00 ng/mL  Lipid panel  Result Value Ref Range   Cholesterol 172 0 - 200 mg/dL   Triglycerides 46.0 0.0 - 149.0 mg/dL   HDL 73.70 >39.00 mg/dL   VLDL 9.2 0.0 - 40.0 mg/dL   LDL Cholesterol 89 0 -  99 mg/dL   Total CHOL/HDL Ratio 2    NonHDL 98.13     Doing great with cholesterol  Tries to choose good fats instead of sat fats    Patient Active Problem List   Diagnosis Date Noted  . Low back pain 06/17/2014  . Groin strain 03/29/2013  . Unspecified sleep apnea 03/28/2013  . Colon cancer screening 09/29/2011  . Snoring 12/02/2010  . Depression 12/02/2010  . Routine general medical examination at a health care facility 11/23/2010  . Prostate cancer screening 11/23/2010  . ANXIETY, SITUATIONAL 01/21/2010  . TINNITUS 01/21/2010   Past Medical History  Diagnosis Date  . Anxiety     Stress reaction  . Hyperlipidemia     borderline   Past Surgical History  Procedure Laterality Date  . Vasectomy    . Rhinoplasty     Social History  Substance Use Topics  . Smoking status: Never Smoker   . Smokeless tobacco: None  . Alcohol Use: 0.0 oz/week    0 Standard drinks or equivalent per week     Comment: Small amount   Family History  Problem Relation Age of Onset  . Heart disease Mother     CAD, bypass  . Diabetes Mother   . Cancer Father     Lymphoma  . Cancer Other     Prostate CA in older years   Allergies  Allergen Reactions  . Shellfish Allergy     REACTION: dizzy and nauseated   Current Outpatient Prescriptions on File  Prior to Visit  Medication Sig Dispense Refill  . Ascorbic Acid (VITAMIN C) 100 MG tablet Take 100 mg by mouth once a week.     Marland Kitchen b complex vitamins tablet Take 1 tablet by mouth daily.    . ferrous sulfate 325 (65 FE) MG tablet Take 325 mg by mouth. Takes rarely (2 a month)    . Flaxseed, Linseed, (FLAX SEED OIL PO) Take by mouth 4 (four) times a week.    . magnesium 30 MG tablet Take 30 mg by mouth daily.     . Misc Natural Products (OSTEO BI-FLEX ADV DOUBLE ST PO) Take 1 tablet by mouth 2 (two) times daily.     No current facility-administered medications on file prior to visit.    Review of Systems    Review of Systems  Constitutional: Negative for fever, appetite change, fatigue and unexpected weight change.  Eyes: Negative for pain and visual disturbance.  Respiratory: Negative for cough and shortness of breath.   Cardiovascular: Negative for cp or palpitations    Gastrointestinal: Negative for nausea, diarrhea and constipation.  Genitourinary: Negative for urgency and frequency.  Skin: Negative for pallor or rash   Neurological: Negative for weakness, light-headedness, numbness and headaches.  Hematological: Negative for adenopathy. Does not bruise/bleed easily.  Psychiatric/Behavioral: Negative for dysphoric mood. The patient is not nervous/anxious.      Objective:   Physical Exam  Constitutional: He appears well-developed and well-nourished. No distress.  Slim and well appearing  HENT:  Head: Normocephalic and atraumatic.  Right Ear: External ear normal.  Left Ear: External ear normal.  Nose: Nose normal.  Mouth/Throat: Oropharynx is clear and moist.  Eyes: Conjunctivae and EOM are normal. Pupils are equal, round, and reactive to light. Right eye exhibits no discharge. Left eye exhibits no discharge. No scleral icterus.  Neck: Normal range of motion. Neck supple. No JVD present. Carotid bruit is not present. No thyromegaly present.  Cardiovascular: Normal rate, regular  rhythm, normal heart sounds and intact distal pulses.  Exam reveals no gallop.   Pulmonary/Chest: Effort normal and breath sounds normal. No respiratory distress. He has no wheezes. He exhibits no tenderness.  Abdominal: Soft. Bowel sounds are normal. He exhibits no distension, no abdominal bruit and no mass. There is no tenderness.  Musculoskeletal: He exhibits no edema or tenderness.  Lymphadenopathy:    He has no cervical adenopathy.  Neurological: He is alert. He has normal reflexes. No cranial nerve deficit. He exhibits normal muscle tone. Coordination normal.  Skin: Skin is warm and dry. No rash noted. No erythema. No pallor.  Lentigines diff  Small blue flat nevus on L forearm   Psychiatric: He has a normal mood and affect.          Assessment & Plan:   Problem List Items Addressed This Visit      Other   Routine general medical examination at a health care facility - Primary    Reviewed health habits including diet and exercise and skin cancer prevention Reviewed appropriate screening tests for age  Also reviewed health mt list, fam hx and immunization status , as well as social and family history   See HPI Labs reviewed  IFOB for colon screen (declines colonosc) Given info on cologuard as well-will see if that is covered for next annual exam  Glad you are doing well  Do the stool kit for colon cancer screening  See if cologuard is covered for next year  If you want to do a colonoscopy in the future let us know  I will fax your forms  Take care of yourself       Prostate cancer screening    Lab Results  Component Value Date   PSA 0.27 07/09/2015   PSA 0.31 06/10/2014   PSA 0.30 08/29/2013   No symptoms No family hx  Will continue to follow      Colon cancer screening    ifob given Declines colonoscopy Given info on cologuard for the future if his insurance covers it       Relevant Orders   Fecal occult blood, imunochemical

## 2015-08-25 DIAGNOSIS — H40013 Open angle with borderline findings, low risk, bilateral: Secondary | ICD-10-CM | POA: Diagnosis not present

## 2015-08-29 ENCOUNTER — Other Ambulatory Visit (INDEPENDENT_AMBULATORY_CARE_PROVIDER_SITE_OTHER): Payer: BLUE CROSS/BLUE SHIELD

## 2015-08-29 DIAGNOSIS — Z1211 Encounter for screening for malignant neoplasm of colon: Secondary | ICD-10-CM | POA: Diagnosis not present

## 2015-08-29 LAB — FECAL OCCULT BLOOD, IMMUNOCHEMICAL: Fecal Occult Bld: NEGATIVE

## 2015-08-29 LAB — FECAL OCCULT BLOOD, GUAIAC: Fecal Occult Blood: NEGATIVE

## 2015-09-01 ENCOUNTER — Encounter: Payer: Self-pay | Admitting: *Deleted

## 2015-11-06 DIAGNOSIS — Z79899 Other long term (current) drug therapy: Secondary | ICD-10-CM | POA: Diagnosis not present

## 2015-11-06 DIAGNOSIS — F321 Major depressive disorder, single episode, moderate: Secondary | ICD-10-CM | POA: Diagnosis not present

## 2015-12-29 DIAGNOSIS — H40013 Open angle with borderline findings, low risk, bilateral: Secondary | ICD-10-CM | POA: Diagnosis not present

## 2015-12-29 DIAGNOSIS — H2513 Age-related nuclear cataract, bilateral: Secondary | ICD-10-CM | POA: Diagnosis not present

## 2015-12-29 DIAGNOSIS — H43811 Vitreous degeneration, right eye: Secondary | ICD-10-CM | POA: Diagnosis not present

## 2016-02-17 DIAGNOSIS — H40023 Open angle with borderline findings, high risk, bilateral: Secondary | ICD-10-CM | POA: Diagnosis not present

## 2016-02-17 DIAGNOSIS — H5213 Myopia, bilateral: Secondary | ICD-10-CM | POA: Diagnosis not present

## 2016-02-17 DIAGNOSIS — H52223 Regular astigmatism, bilateral: Secondary | ICD-10-CM | POA: Diagnosis not present

## 2016-02-17 DIAGNOSIS — H524 Presbyopia: Secondary | ICD-10-CM | POA: Diagnosis not present

## 2016-06-05 ENCOUNTER — Telehealth: Payer: Self-pay | Admitting: Family Medicine

## 2016-06-05 DIAGNOSIS — Z0000 Encounter for general adult medical examination without abnormal findings: Secondary | ICD-10-CM

## 2016-06-05 DIAGNOSIS — Z125 Encounter for screening for malignant neoplasm of prostate: Secondary | ICD-10-CM

## 2016-06-05 NOTE — Telephone Encounter (Signed)
-----   Message from Alvina Chouerri J Walsh sent at 06/04/2016  9:28 AM EDT ----- Regarding: Lab orders for Wednesday, 5.9.18 Patient is scheduled for CPX labs, please order future labs, Thanks , Camelia Engerri

## 2016-06-09 ENCOUNTER — Other Ambulatory Visit (INDEPENDENT_AMBULATORY_CARE_PROVIDER_SITE_OTHER): Payer: BLUE CROSS/BLUE SHIELD

## 2016-06-09 DIAGNOSIS — Z125 Encounter for screening for malignant neoplasm of prostate: Secondary | ICD-10-CM

## 2016-06-09 DIAGNOSIS — Z0000 Encounter for general adult medical examination without abnormal findings: Secondary | ICD-10-CM | POA: Diagnosis not present

## 2016-06-09 LAB — LIPID PANEL
CHOLESTEROL: 200 mg/dL (ref 0–200)
HDL: 77.3 mg/dL (ref >39.00)
LDL CALC: 111 mg/dL — AB (ref 0–99)
NonHDL: 122.69
Total CHOL/HDL Ratio: 3
Triglycerides: 59 mg/dL (ref 0.0–149.0)
VLDL: 11.8 mg/dL (ref 0.0–40.0)

## 2016-06-09 LAB — CBC WITH DIFFERENTIAL/PLATELET
BASOS ABS: 0 10*3/uL (ref 0.0–0.1)
Basophils Relative: 1 % (ref 0.0–3.0)
EOS ABS: 0.2 10*3/uL (ref 0.0–0.7)
Eosinophils Relative: 4 % (ref 0.0–5.0)
HEMATOCRIT: 45 % (ref 39.0–52.0)
HEMOGLOBIN: 15.3 g/dL (ref 13.0–17.0)
LYMPHS PCT: 34.6 % (ref 12.0–46.0)
Lymphs Abs: 1.4 10*3/uL (ref 0.7–4.0)
MCHC: 34 g/dL (ref 30.0–36.0)
MCV: 94.4 fl (ref 78.0–100.0)
MONO ABS: 0.5 10*3/uL (ref 0.1–1.0)
Monocytes Relative: 11 % (ref 3.0–12.0)
Neutro Abs: 2.1 10*3/uL (ref 1.4–7.7)
Neutrophils Relative %: 49.4 % (ref 43.0–77.0)
Platelets: 263 10*3/uL (ref 150.0–400.0)
RBC: 4.76 Mil/uL (ref 4.22–5.81)
RDW: 13.6 % (ref 11.5–15.5)
WBC: 4.2 10*3/uL (ref 4.0–10.5)

## 2016-06-09 LAB — COMPREHENSIVE METABOLIC PANEL
ALBUMIN: 4.5 g/dL (ref 3.5–5.2)
ALK PHOS: 106 U/L (ref 39–117)
ALT: 17 U/L (ref 0–53)
AST: 17 U/L (ref 0–37)
BUN: 21 mg/dL (ref 6–23)
CHLORIDE: 104 meq/L (ref 96–112)
CO2: 31 mEq/L (ref 19–32)
Calcium: 9.5 mg/dL (ref 8.4–10.5)
Creatinine, Ser: 1 mg/dL (ref 0.40–1.50)
GFR: 81.74 mL/min (ref >60.00)
Glucose, Bld: 100 mg/dL — ABNORMAL HIGH (ref 70–99)
Potassium: 4 mEq/L (ref 3.5–5.1)
SODIUM: 141 meq/L (ref 135–145)
TOTAL PROTEIN: 6.7 g/dL (ref 6.0–8.3)
Total Bilirubin: 0.8 mg/dL (ref 0.2–1.2)

## 2016-06-09 LAB — TSH: TSH: 2.14 u[IU]/mL (ref 0.35–4.50)

## 2016-06-09 LAB — PSA: PSA: 0.31 ng/mL (ref 0.10–4.00)

## 2016-06-16 ENCOUNTER — Other Ambulatory Visit: Payer: BLUE CROSS/BLUE SHIELD

## 2016-06-23 ENCOUNTER — Encounter: Payer: Self-pay | Admitting: Family Medicine

## 2016-06-23 ENCOUNTER — Ambulatory Visit (INDEPENDENT_AMBULATORY_CARE_PROVIDER_SITE_OTHER): Payer: BLUE CROSS/BLUE SHIELD | Admitting: Family Medicine

## 2016-06-23 VITALS — BP 100/70 | HR 55 | Temp 98.2°F | Ht 67.5 in | Wt 150.8 lb

## 2016-06-23 DIAGNOSIS — Z0000 Encounter for general adult medical examination without abnormal findings: Secondary | ICD-10-CM | POA: Diagnosis not present

## 2016-06-23 DIAGNOSIS — Z125 Encounter for screening for malignant neoplasm of prostate: Secondary | ICD-10-CM | POA: Diagnosis not present

## 2016-06-23 DIAGNOSIS — Z1211 Encounter for screening for malignant neoplasm of colon: Secondary | ICD-10-CM | POA: Diagnosis not present

## 2016-06-23 NOTE — Progress Notes (Signed)
Subjective:    Patient ID: Patrick Mcpherson, male    DOB: 1960/04/19, 56 y.o.   MRN: [id number]  HPI Here for health maintenance exam and to review chronic medical problems   Feeling well   Very busy at work this year and keeping up with some triathalon training  Did one a few weeks ago -went well  Does not have one scheduled yet   Wt Readings from Last 3 Encounters:  06/23/16 150 lb 12 oz (68.4 kg)  07/15/15 154 lb (69.9 kg)  06/17/14 151 lb (68.5 kg)  may have lost 1/2 inch of ht  Did run last night  Posture varies  bmi 23.2  Eats healthy overall    Declines hep C and HIV screen due to low risk   ifob 7/17 negative He is not ready for a colonoscopy yet    Tetanus shot 9/14  Prostate cancer screen No symptoms  No nocturia at all  No flow change at all  No fam hx of prostate cancer  Lab Results  Component Value Date   PSA 0.31 06/09/2016   PSA 0.27 07/09/2015   PSA 0.31 06/10/2014    Results for orders placed or performed in visit on 06/09/16  CBC with Differential/Platelet  Result Value Ref Range   WBC 4.2 4.0 - 10.5 K/uL   RBC 4.76 4.22 - 5.81 Mil/uL   Hemoglobin 15.3 13.0 - 17.0 g/dL   HCT 40.9 81.1 - 91.4 %   MCV 94.4 78.0 - 100.0 fl   MCHC 34.0 30.0 - 36.0 g/dL   RDW 78.2 95.6 - 21.3 %   Platelets 263.0 150.0 - 400.0 K/uL   Neutrophils Relative % 49.4 43.0 - 77.0 %   Lymphocytes Relative 34.6 12.0 - 46.0 %   Monocytes Relative 11.0 3.0 - 12.0 %   Eosinophils Relative 4.0 0.0 - 5.0 %   Basophils Relative 1.0 0.0 - 3.0 %   Neutro Abs 2.1 1.4 - 7.7 K/uL   Lymphs Abs 1.4 0.7 - 4.0 K/uL   Monocytes Absolute 0.5 0.1 - 1.0 K/uL   Eosinophils Absolute 0.2 0.0 - 0.7 K/uL   Basophils Absolute 0.0 0.0 - 0.1 K/uL  Comprehensive metabolic panel  Result Value Ref Range   Sodium 141 135 - 145 mEq/L   Potassium 4.0 3.5 - 5.1 mEq/L   Chloride 104 96 - 112 mEq/L   CO2 31 19 - 32 mEq/L   Glucose, Bld 100 (H) 70 - 99 mg/dL   BUN 21 6 - 23 mg/dL   Creatinine, Ser 0.86 0.40 - 1.50 mg/dL   Total Bilirubin 0.8 0.2 - 1.2 mg/dL   Alkaline Phosphatase 106 39 - 117 U/L   AST 17 0 - 37 U/L   ALT 17 0 - 53 U/L   Total Protein 6.7 6.0 - 8.3 g/dL   Albumin 4.5 3.5 - 5.2 g/dL   Calcium 9.5 8.4 - 57.8 mg/dL   GFR 46.96 >29.52 mL/min  Lipid panel  Result Value Ref Range   Cholesterol 200 0 - 200 mg/dL   Triglycerides 84.1 0.0 - 149.0 mg/dL   HDL 32.44 >01.02 mg/dL   VLDL 72.5 0.0 - 36.6 mg/dL   LDL Cholesterol 440 (H) 0 - 99 mg/dL   Total CHOL/HDL Ratio 3    NonHDL 122.69   PSA  Result Value Ref Range   PSA 0.31 0.10 - 4.00 ng/mL  TSH  Result Value Ref Range   TSH 2.14 0.35 - 4.50 uIU/mL  Cholesterol  Lab Results  Component Value Date   CHOL 200 06/09/2016   CHOL 172 07/09/2015   CHOL 164 06/10/2014   Lab Results  Component Value Date   HDL 77.30 06/09/2016   HDL 73.70 07/09/2015   HDL 72.10 06/10/2014   Lab Results  Component Value Date   LDLCALC 111 (H) 06/09/2016   LDLCALC 89 07/09/2015   LDLCALC 81 06/10/2014   Lab Results  Component Value Date   TRIG 59.0 06/09/2016   TRIG 46.0 07/09/2015   TRIG 55.0 06/10/2014   Lab Results  Component Value Date   CHOLHDL 3 06/09/2016   CHOLHDL 2 07/09/2015   CHOLHDL 2 06/10/2014   Lab Results  Component Value Date   LDLDIRECT 120.9 11/24/2010   LDLDIRECT 132.0 01/14/2010   Pt states 3 meals prior to his labs were not optimal- steak/ [place] with beef and hamburgers Highly unusual for him  This may be why LDL is up   Patient Active Problem List   Diagnosis Date Noted  . Low back pain 06/17/2014  . Unspecified sleep apnea 03/28/2013  . Colon cancer screening 09/29/2011  . Snoring 12/02/2010  . Routine general medical examination at a health care facility 11/23/2010  . Prostate cancer screening 11/23/2010  . ANXIETY, SITUATIONAL 01/21/2010  . TINNITUS 01/21/2010   Past Medical History:  Diagnosis Date  . Anxiety    Stress reaction  . Hyperlipidemia     borderline   Past Surgical History:  Procedure Laterality Date  . RHINOPLASTY    . VASECTOMY     Social History  Substance Use Topics  . Smoking status: Never Smoker  . Smokeless tobacco: Never Used  . Alcohol use 0.0 oz/week     Comment: Small amount   Family History  Problem Relation Age of Onset  . Heart disease Mother        CAD, bypass  . Diabetes Mother   . Cancer Father        Lymphoma  . Cancer Other        Prostate CA in older years   Allergies  Allergen Reactions  . Shellfish Allergy     REACTION: dizzy and nauseated   Current Outpatient Prescriptions on File Prior to Visit  Medication Sig Dispense Refill  . Ascorbic Acid (VITAMIN C) 100 MG tablet Take 100 mg by mouth once a week.     Marland Kitchen b complex vitamins tablet Take 1 tablet by mouth daily.    . Coenzyme Q10 (CO Q 10 PO) Take 1 tablet by mouth daily.    . Flaxseed, Linseed, (FLAX SEED OIL PO) Take by mouth 4 (four) times a week.    . magnesium 30 MG tablet Take 30 mg by mouth daily.     . Misc Natural Products (OSTEO BI-FLEX ADV DOUBLE ST PO) Take 1 tablet by mouth 2 (two) times daily.     No current facility-administered medications on file prior to visit.     Review of Systems  Constitutional: Negative.   HENT: Negative for congestion, hearing loss, rhinorrhea and trouble swallowing.   Eyes: Negative for pain, itching and visual disturbance.  Respiratory: Negative for cough, chest tightness, shortness of breath and wheezing.   Cardiovascular: Negative for chest pain, palpitations and leg swelling.  Gastrointestinal: Negative for abdominal distention, abdominal pain, blood in stool, constipation, diarrhea and nausea.  Endocrine: Negative for cold intolerance, heat intolerance, polydipsia and polyuria.  Genitourinary: Negative for difficulty urinating, dysuria, frequency and urgency.  Musculoskeletal:  Negative for arthralgias, joint swelling and myalgias.  Skin: Negative for pallor and rash.    Neurological: Negative for dizziness, tremors, numbness and headaches.  Hematological: Negative for adenopathy. Does not bruise/bleed easily.  Psychiatric/Behavioral: Negative for decreased concentration and dysphoric mood. The patient is not nervous/anxious.        Objective:   Physical Exam  Constitutional: He appears well-developed and well-nourished. No distress.  Slim and well appearing   HENT:  Head: Normocephalic and atraumatic.  Right Ear: External ear normal.  Left Ear: External ear normal.  Nose: Nose normal.  Mouth/Throat: Oropharynx is clear and moist.  Eyes: Conjunctivae and EOM are normal. Pupils are equal, round, and reactive to light. Right eye exhibits no discharge. Left eye exhibits no discharge. No scleral icterus.  Neck: Normal range of motion. Neck supple. No JVD present. Carotid bruit is not present. No thyromegaly present.  Cardiovascular: Normal rate, regular rhythm, normal heart sounds and intact distal pulses.  Exam reveals no gallop.   Pulmonary/Chest: Effort normal and breath sounds normal. No respiratory distress. He has no wheezes. He exhibits no tenderness.  Abdominal: Soft. Bowel sounds are normal. He exhibits no distension, no abdominal bruit and no mass. There is no tenderness.  Musculoskeletal: He exhibits no edema or tenderness.  Lymphadenopathy:    He has no cervical adenopathy.  Neurological: He is alert. He has normal reflexes. No cranial nerve deficit. He exhibits normal muscle tone. Coordination normal.  Skin: Skin is warm and dry. No rash noted. No erythema. No pallor.  Fair with some brown nevi  Psychiatric: He has a normal mood and affect.          Assessment & Plan:   Problem List Items Addressed This Visit      Other   Colon cancer screening - Primary    Pt is considering opt  D/w patient AV:[id number]:options for colon cancer screening, including IFOB vs. Colonoscopy vs cologuard if covered    Risks and benefits of both were discussed and  patient voiced understanding.  He will decide and let us know       Prostate cancer screening    Lab Results  Component Value Date   PSA 0.31 06/09/2016   PSA 0.27 07/09/2015   PSA 0.31 06/10/2014   Caucasian /no symptoms /no fam hx  Will consider to obs      Routine general medical examination at a health care facility    Reviewed health habits including diet and exercise and skin cancer prevention Reviewed appropriate screening tests for age  Also reviewed health mt list, fam hx and immunization status , as well as social and family history   See hPI Labs rev  He is thinking about colon cancer screening and will decide on something soon  Disc sun protection  Keep up good exercise and healthy diet

## 2016-06-23 NOTE — Patient Instructions (Addendum)
Call your insurance about cologuard (I prefer that to our ifob stool kit) If not covered - we will do our ifob  If you change your mind about colonoscopy let us know   Labs look ok - cholesterol is up a bit  Avoid red meat/ fried foods/ egg yolks/ fatty breakfast meats/ butter, cheese and high fat dairy/ and shellfish     Keep up the good work with exercise

## 2016-06-24 NOTE — Assessment & Plan Note (Signed)
Lab Results  Component Value Date   PSA 0.31 06/09/2016   PSA 0.27 07/09/2015   PSA 0.31 06/10/2014   Caucasian /no symptoms /no fam hx  Will consider to obs

## 2016-06-24 NOTE — Assessment & Plan Note (Signed)
Reviewed health habits including diet and exercise and skin cancer prevention Reviewed appropriate screening tests for age  Also reviewed health mt list, fam hx and immunization status , as well as social and family history   See hPI Labs rev  He is thinking about colon cancer screening and will decide on something soon  Disc sun protection  Keep up good exercise and healthy diet

## 2016-06-24 NOTE — Assessment & Plan Note (Signed)
Pt is considering opt  D/w patient [id number]:options for colon cancer screening, including IFOB vs. Colonoscopy vs cologuard if covered    Risks and benefits of both were discussed and patient voiced understanding.  He will decide and let us know

## 2016-07-06 ENCOUNTER — Telehealth: Payer: Self-pay | Admitting: Family Medicine

## 2016-07-06 NOTE — Telephone Encounter (Signed)
Pt called his insurance will pay for colorguard as long as it is coded as preventive.

## 2016-07-06 NOTE — Telephone Encounter (Signed)
Form completed and faxed as requested.

## 2016-07-06 NOTE — Telephone Encounter (Signed)
Good-I put an order form in the IN box

## 2016-07-07 DIAGNOSIS — H40013 Open angle with borderline findings, low risk, bilateral: Secondary | ICD-10-CM | POA: Diagnosis not present

## 2016-07-07 DIAGNOSIS — H43811 Vitreous degeneration, right eye: Secondary | ICD-10-CM | POA: Diagnosis not present

## 2016-07-07 DIAGNOSIS — H2513 Age-related nuclear cataract, bilateral: Secondary | ICD-10-CM | POA: Diagnosis not present

## 2016-07-20 DIAGNOSIS — Z1211 Encounter for screening for malignant neoplasm of colon: Secondary | ICD-10-CM | POA: Diagnosis not present

## 2016-07-20 DIAGNOSIS — Z1212 Encounter for screening for malignant neoplasm of rectum: Secondary | ICD-10-CM | POA: Diagnosis not present

## 2016-07-20 LAB — COLOGUARD

## 2016-11-08 DIAGNOSIS — H43392 Other vitreous opacities, left eye: Secondary | ICD-10-CM | POA: Diagnosis not present

## 2016-11-08 DIAGNOSIS — H40013 Open angle with borderline findings, low risk, bilateral: Secondary | ICD-10-CM | POA: Diagnosis not present

## 2016-11-08 DIAGNOSIS — H43811 Vitreous degeneration, right eye: Secondary | ICD-10-CM | POA: Diagnosis not present

## 2016-11-08 DIAGNOSIS — H2513 Age-related nuclear cataract, bilateral: Secondary | ICD-10-CM | POA: Diagnosis not present

## 2016-12-17 DIAGNOSIS — L570 Actinic keratosis: Secondary | ICD-10-CM | POA: Diagnosis not present

## 2016-12-17 DIAGNOSIS — X32XXXD Exposure to sunlight, subsequent encounter: Secondary | ICD-10-CM | POA: Diagnosis not present

## 2016-12-17 DIAGNOSIS — L908 Other atrophic disorders of skin: Secondary | ICD-10-CM | POA: Diagnosis not present

## 2016-12-17 DIAGNOSIS — Z1283 Encounter for screening for malignant neoplasm of skin: Secondary | ICD-10-CM | POA: Diagnosis not present

## 2016-12-17 DIAGNOSIS — D1801 Hemangioma of skin and subcutaneous tissue: Secondary | ICD-10-CM | POA: Diagnosis not present

## 2016-12-17 DIAGNOSIS — D225 Melanocytic nevi of trunk: Secondary | ICD-10-CM | POA: Diagnosis not present

## 2016-12-17 DIAGNOSIS — D1809 Hemangioma of other sites: Secondary | ICD-10-CM | POA: Diagnosis not present

## 2016-12-20 DIAGNOSIS — H43811 Vitreous degeneration, right eye: Secondary | ICD-10-CM | POA: Diagnosis not present

## 2016-12-20 DIAGNOSIS — H43392 Other vitreous opacities, left eye: Secondary | ICD-10-CM | POA: Diagnosis not present

## 2016-12-20 DIAGNOSIS — H40013 Open angle with borderline findings, low risk, bilateral: Secondary | ICD-10-CM | POA: Diagnosis not present

## 2016-12-20 DIAGNOSIS — H2513 Age-related nuclear cataract, bilateral: Secondary | ICD-10-CM | POA: Diagnosis not present

## 2017-01-12 DIAGNOSIS — H40003 Preglaucoma, unspecified, bilateral: Secondary | ICD-10-CM | POA: Diagnosis not present

## 2017-03-22 ENCOUNTER — Telehealth: Payer: Self-pay | Admitting: Family Medicine

## 2017-03-22 DIAGNOSIS — Z0000 Encounter for general adult medical examination without abnormal findings: Secondary | ICD-10-CM

## 2017-03-22 DIAGNOSIS — Z125 Encounter for screening for malignant neoplasm of prostate: Secondary | ICD-10-CM

## 2017-03-22 NOTE — Telephone Encounter (Signed)
Copied from CRM [id number]. Topic: General - Other >> Mar 22, 2017  3:34 PM Cecelia ByarsGreen, Temeka L, RMA wrote: Reason for CRM: patient is  requesting fasting labs before physical on 06/20/17, please place order and call pt for scheduling

## 2017-03-23 NOTE — Telephone Encounter (Signed)
I put the order in

## 2017-06-14 ENCOUNTER — Encounter (INDEPENDENT_AMBULATORY_CARE_PROVIDER_SITE_OTHER): Payer: Self-pay | Admitting: Orthopaedic Surgery

## 2017-06-14 ENCOUNTER — Ambulatory Visit (INDEPENDENT_AMBULATORY_CARE_PROVIDER_SITE_OTHER): Payer: BLUE CROSS/BLUE SHIELD | Admitting: Orthopaedic Surgery

## 2017-06-14 VITALS — BP 122/72 | HR 63 | Resp 14 | Ht 68.0 in | Wt 150.0 lb

## 2017-06-14 DIAGNOSIS — M25562 Pain in left knee: Secondary | ICD-10-CM | POA: Diagnosis not present

## 2017-06-14 MED ORDER — DICLOFENAC SODIUM 1 % TD GEL: TRANSDERMAL | 2 refills | Status: DC

## 2017-06-14 MED ORDER — BUPIVACAINE HCL 0.5 % IJ SOLN
2.0000 mL | INTRAMUSCULAR | Status: AC | PRN
  Administered 2017-06-14: 2 mL via INTRA_ARTICULAR

## 2017-06-14 MED ORDER — METHYLPREDNISOLONE ACETATE 40 MG/ML IJ SUSP
80.0000 mg | INTRAMUSCULAR | Status: AC | PRN
  Administered 2017-06-14: 80 mg

## 2017-06-14 MED ORDER — LIDOCAINE HCL 1 % IJ SOLN
2.0000 mL | INTRAMUSCULAR | Status: AC | PRN
  Administered 2017-06-14: 2 mL

## 2017-06-14 NOTE — Progress Notes (Signed)
Office Visit Note   Patient: Patrick Mcpherson           Date of Birth: 04-24-1960           MRN: [id number] Visit Date: 06/14/2017              Requested by: Tower, Audrie Gallus, MD [address] [place], [place] [address] PCP: Judy Pimple, MD   Assessment & Plan: Visit Diagnoses:  1. Acute pain of left knee     Plan: Patrick Mcpherson noted insidious onset of medial left knee pain over the past several months.  He is an avid runner and notes that  the pain is "nuisance".  He is been applying Voltaren gel to the localized area of tenderness along the posterior medial knee joint.  He has several diagnostic possibilities including a small peripheral tear of the medial meniscus.  He could have a small strain of the medial collateral ligament .  We had discussed several treatment options he like to try local cortisone injection.  This was performed without problem.  If he continues to have a problem it be worth obtaining an x-ray and possibly an MRI scan.  He will limit his activities over the next 48 hours and and follow-up as necessary  Follow-Up Instructions: Return if symptoms worsen or fail to improve.   Orders:  Orders Placed This Encounter  Procedures  . Large Joint Inj: L knee   Meds ordered this encounter  Medications  . diclofenac sodium (VOLTAREN) 1 % GEL    Sig: Apply to large joint area as needed    Dispense:  2 Tube    Refill:  2      Procedures: Large Joint Inj: L knee on 06/14/2017 9:52 AM Indications: pain and diagnostic evaluation Details: 27 G 1.5 in needle, medial approach  Arthrogram: No  Medications: 2 mL lidocaine 1 %; 2 mL bupivacaine 0.5 %; 80 mg methylPREDNISolone acetate 40 MG/ML  Injection performed along the posterior medial corner left knee.  I felt like I entered the joint with a 27 needle Procedure, treatment alternatives, risks and benefits explained, specific risks discussed. Consent was given by the patient. Patient was prepped and  draped in the usual sterile fashion.       Clinical Data: No additional findings.   Subjective: Chief Complaint  Patient presents with  . Left Knee - Pain  . Knee Pain    Left knee pain x 10 weeks, no injury, not diabetic, no surgery to knee, swelling,   Patrick Mcpherson is a very active runner.  He is presently training for an event in the next month.  Is been experiencing some pain along the posterior medial corner of his left knee without history of injury or trauma.  Has been applying Voltaren gel it seems to "help".  He is able to run but does note some discomfort.  Is been fairly well localized.  No pain laterally or beneath the patella.  He has not had a specific event where he injured the knee.  There is been no effusion or sensation of his knee giving way.  He felt like he had a similar problem with his right knee years ago that simply resolved without treatment..  This pain seems to have "lasted a little longer"  HPI  Review of Systems  Constitutional: Negative for activity change.  HENT: Negative for trouble swallowing.   Eyes: Negative for pain.  Respiratory: Negative for shortness of breath.  Cardiovascular: Negative for leg swelling.  Gastrointestinal: Negative for constipation.  Endocrine: Negative for cold intolerance.  Genitourinary: Negative for difficulty urinating.  Musculoskeletal: Negative for joint swelling.  Skin: Negative for rash.  Allergic/Immunologic: Positive for food allergies.  Neurological: Positive for weakness.  Hematological: Does not bruise/bleed easily.  Psychiatric/Behavioral: Negative for sleep disturbance.     Objective: Vital Signs: BP 122/72 (BP Location: Left Arm, Patient Position: Sitting, Cuff Size: Normal)   Pulse 63   Resp 14   Ht  (1.727 m)   Wt 150 lb (68 kg)   BMI 22.81 kg/m   Physical Exam  Constitutional: He is oriented to person, place, and time. He appears well-developed and well-nourished.  HENT:    Mouth/Throat: Oropharynx is clear and moist.  Eyes: Pupils are equal, round, and reactive to light. EOM are normal.  Pulmonary/Chest: Effort normal.  Neurological: He is alert and oriented to person, place, and time.  Skin: Skin is warm and dry.  Psychiatric: He has a normal mood and affect. His behavior is normal.    Ortho Exam awake alert and oriented x3.  Comfortable sitting.  Examination left knee reveals some very minimal tenderness along the posterior medial corner of the knee.  No effusion.  No popping clicking.  No pain with patella motion.  No anterior medial joint pain.  Skin intact.  Neurovascular exam intact.  No popliteal pain.  No calf discomfort.  No distal edema.  No evidence of instability no pain along the medial collateral ligament area. I did not feel a cystic structure  Specialty Comments:  No specialty comments available.  Imaging: No results found.   PMFS History: Patient Active Problem List   Diagnosis Date Noted  . Low back pain 06/17/2014  . Unspecified sleep apnea 03/28/2013  . Colon cancer screening 09/29/2011  . Snoring 12/02/2010  . Routine general medical examination at a health care facility 11/23/2010  . Prostate cancer screening 11/23/2010  . ANXIETY, SITUATIONAL 01/21/2010  . TINNITUS 01/21/2010   Past Medical History:  Diagnosis Date  . Anxiety    Stress reaction  . Hyperlipidemia    borderline    Family History  Problem Relation Age of Onset  . Cancer Other        Prostate CA in older years  . Heart disease Mother        CAD, bypass  . Diabetes Mother   . Cancer Father        Lymphoma    Past Surgical History:  Procedure Laterality Date  . RHINOPLASTY    . VASECTOMY     Social History   Occupational History  . Occupation: Event organiser: [employer]  Tobacco Use  . Smoking status: Never Smoker  . Smokeless tobacco: Never Used  Substance and Sexual Activity  . Alcohol use: Yes    Alcohol/week: 0.0 oz    Comment:  RARELY  . Drug use: No  . Sexual activity: Not on file

## 2017-06-15 ENCOUNTER — Other Ambulatory Visit (INDEPENDENT_AMBULATORY_CARE_PROVIDER_SITE_OTHER): Payer: BLUE CROSS/BLUE SHIELD

## 2017-06-15 DIAGNOSIS — Z125 Encounter for screening for malignant neoplasm of prostate: Secondary | ICD-10-CM

## 2017-06-15 DIAGNOSIS — Z0000 Encounter for general adult medical examination without abnormal findings: Secondary | ICD-10-CM

## 2017-06-15 LAB — COMPREHENSIVE METABOLIC PANEL
ALBUMIN: 4.3 g/dL (ref 3.5–5.2)
ALK PHOS: 129 U/L — AB (ref 39–117)
ALT: 16 U/L (ref 0–53)
AST: 16 U/L (ref 0–37)
BUN: 16 mg/dL (ref 6–23)
CALCIUM: 9.6 mg/dL (ref 8.4–10.5)
CO2: 31 mEq/L (ref 19–32)
Chloride: 101 mEq/L (ref 96–112)
Creatinine, Ser: 0.99 mg/dL (ref 0.40–1.50)
GFR: 82.4 mL/min (ref >60.00)
Glucose, Bld: 109 mg/dL — ABNORMAL HIGH (ref 70–99)
POTASSIUM: 3.9 meq/L (ref 3.5–5.1)
Sodium: 140 mEq/L (ref 135–145)
TOTAL PROTEIN: 6.6 g/dL (ref 6.0–8.3)
Total Bilirubin: 0.5 mg/dL (ref 0.2–1.2)

## 2017-06-15 LAB — CBC WITH DIFFERENTIAL/PLATELET
Basophils Absolute: 0 10*3/uL (ref 0.0–0.1)
Basophils Relative: 0.3 % (ref 0.0–3.0)
EOS PCT: 0.9 % (ref 0.0–5.0)
Eosinophils Absolute: 0.1 10*3/uL (ref 0.0–0.7)
HEMATOCRIT: 43.8 % (ref 39.0–52.0)
HEMOGLOBIN: 14.9 g/dL (ref 13.0–17.0)
Lymphocytes Relative: 15.4 % (ref 12.0–46.0)
Lymphs Abs: 1.1 10*3/uL (ref 0.7–4.0)
MCHC: 33.9 g/dL (ref 30.0–36.0)
MCV: 93.8 fl (ref 78.0–100.0)
MONO ABS: 0.7 10*3/uL (ref 0.1–1.0)
MONOS PCT: 9.3 % (ref 3.0–12.0)
Neutro Abs: 5.2 10*3/uL (ref 1.4–7.7)
Neutrophils Relative %: 74.1 % (ref 43.0–77.0)
Platelets: 254 10*3/uL (ref 150.0–400.0)
RBC: 4.67 Mil/uL (ref 4.22–5.81)
RDW: 13.5 % (ref 11.5–15.5)
WBC: 7 10*3/uL (ref 4.0–10.5)

## 2017-06-15 LAB — LIPID PANEL
CHOLESTEROL: 180 mg/dL (ref 0–200)
HDL: 78.8 mg/dL (ref >39.00)
LDL CALC: 90 mg/dL (ref 0–99)
NonHDL: 100.7
Total CHOL/HDL Ratio: 2
Triglycerides: 56 mg/dL (ref 0.0–149.0)
VLDL: 11.2 mg/dL (ref 0.0–40.0)

## 2017-06-15 LAB — TSH: TSH: 1.53 u[IU]/mL (ref 0.35–4.50)

## 2017-06-15 LAB — PSA: PSA: 0.31 ng/mL (ref 0.10–4.00)

## 2017-06-20 ENCOUNTER — Ambulatory Visit (INDEPENDENT_AMBULATORY_CARE_PROVIDER_SITE_OTHER): Payer: BLUE CROSS/BLUE SHIELD | Admitting: Family Medicine

## 2017-06-20 ENCOUNTER — Encounter: Payer: Self-pay | Admitting: Family Medicine

## 2017-06-20 VITALS — BP 108/68 | HR 83 | Temp 98.4°F | Ht 68.0 in | Wt 149.0 lb

## 2017-06-20 DIAGNOSIS — Z1211 Encounter for screening for malignant neoplasm of colon: Secondary | ICD-10-CM

## 2017-06-20 DIAGNOSIS — R7309 Other abnormal glucose: Secondary | ICD-10-CM

## 2017-06-20 DIAGNOSIS — Z0000 Encounter for general adult medical examination without abnormal findings: Secondary | ICD-10-CM

## 2017-06-20 DIAGNOSIS — R7303 Prediabetes: Secondary | ICD-10-CM | POA: Insufficient documentation

## 2017-06-20 DIAGNOSIS — Z125 Encounter for screening for malignant neoplasm of prostate: Secondary | ICD-10-CM

## 2017-06-20 NOTE — Progress Notes (Signed)
Subjective:    Patient ID: Patrick Mcpherson, male    DOB: 13-May-1960, 57 y.o.   MRN: [id number]  HPI  Here for health maintenance exam and to review chronic medical problems    Wt Readings from Last 3 Encounters:  06/20/17 149 lb (67.6 kg)  06/14/17 150 lb (68 kg)  06/23/16 150 lb 12 oz (68.4 kg)  still running  Did senior games in march  Will go to nat finals in [place] - upcoming (5 K)  Staying in shape  22.66 kg/m   Feeling good overall   Colon cancer screening  Did ifob 7/17 Interested in cologuard - he did check with ins   Flu shot - did not get last fall   Tetanus shot 9/14  Prostate health  Lab Results  Component Value Date   PSA 0.31 06/15/2017   PSA 0.31 06/09/2016   PSA 0.27 07/09/2015  good  No family hx of prostate cancer  No problems with urination  No nocturia    Cholesterol  Lab Results  Component Value Date   CHOL 180 06/15/2017   CHOL 200 06/09/2016   CHOL 172 07/09/2015   Lab Results  Component Value Date   HDL 78.80 06/15/2017   HDL 77.30 06/09/2016   HDL 73.70 07/09/2015   Lab Results  Component Value Date   LDLCALC 90 06/15/2017   LDLCALC 111 (H) 06/09/2016   LDLCALC 89 07/09/2015   Lab Results  Component Value Date   TRIG 56.0 06/15/2017   TRIG 59.0 06/09/2016   TRIG 46.0 07/09/2015   Lab Results  Component Value Date   CHOLHDL 2 06/15/2017   CHOLHDL 3 06/09/2016   CHOLHDL 2 07/09/2015   Lab Results  Component Value Date   LDLDIRECT 120.9 11/24/2010   LDLDIRECT 132.0 01/14/2010   LDL is down to 90  Eats healthy  Exercises/runs aw well   Other labs Results for orders placed or performed in visit on 06/15/17  TSH  Result Value Ref Range   TSH 1.53 0.35 - 4.50 uIU/mL  PSA  Result Value Ref Range   PSA 0.31 0.10 - 4.00 ng/mL  Lipid panel  Result Value Ref Range   Cholesterol 180 0 - 200 mg/dL   Triglycerides 56.0 0.0 - 149.0 mg/dL   HDL 78.80 >39.00 mg/dL   VLDL 11.2 0.0 - 40.0 mg/dL   LDL Cholesterol 90  0 - 99 mg/dL   Total CHOL/HDL Ratio 2    NonHDL 100.70   Comprehensive metabolic panel  Result Value Ref Range   Sodium 140 135 - 145 mEq/L   Potassium 3.9 3.5 - 5.1 mEq/L   Chloride 101 96 - 112 mEq/L   CO2 31 19 - 32 mEq/L   Glucose, Bld 109 (H) 70 - 99 mg/dL   BUN 16 6 - 23 mg/dL   Creatinine, Ser 0.99 0.40 - 1.50 mg/dL   Total Bilirubin 0.5 0.2 - 1.2 mg/dL   Alkaline Phosphatase 129 (H) 39 - 117 U/L   AST 16 0 - 37 U/L   ALT 16 0 - 53 U/L   Total Protein 6.6 6.0 - 8.3 g/dL   Albumin 4.3 3.5 - 5.2 g/dL   Calcium 9.6 8.4 - 10.5 mg/dL   GFR 82.40 >60.00 mL/min  CBC with Differential/Platelet  Result Value Ref Range   WBC 7.0 4.0 - 10.5 K/uL   RBC 4.67 4.22 - 5.81 Mil/uL   Hemoglobin 14.9 13.0 - 17.0 g/dL   HCT 43.8 39.0 -  52.0 %   MCV 93.8 78.0 - 100.0 fl   MCHC 33.9 30.0 - 36.0 g/dL   RDW 13.5 11.5 - 15.5 %   Platelets 254.0 150.0 - 400.0 K/uL   Neutrophils Relative % 74.1 43.0 - 77.0 %   Lymphocytes Relative 15.4 12.0 - 46.0 %   Monocytes Relative 9.3 3.0 - 12.0 %   Eosinophils Relative 0.9 0.0 - 5.0 %   Basophils Relative 0.3 0.0 - 3.0 %   Neutro Abs 5.2 1.4 - 7.7 K/uL   Lymphs Abs 1.1 0.7 - 4.0 K/uL   Monocytes Absolute 0.7 0.1 - 1.0 K/uL   Eosinophils Absolute 0.1 0.0 - 0.7 K/uL   Basophils Absolute 0.0 0.0 - 0.1 K/uL    Has taken a small dose of iron training for a high alt race    Patient Active Problem List   Diagnosis Date Noted  . Elevated glucose level 06/20/2017  . Low back pain 06/17/2014  . Unspecified sleep apnea 03/28/2013  . Colon cancer screening 09/29/2011  . Snoring 12/02/2010  . Routine general medical examination at a health care facility 11/23/2010  . Prostate cancer screening 11/23/2010  . ANXIETY, SITUATIONAL 01/21/2010  . TINNITUS 01/21/2010   Past Medical History:  Diagnosis Date  . Anxiety    Stress reaction  . Hyperlipidemia    borderline   Past Surgical History:  Procedure Laterality Date  . RHINOPLASTY    . VASECTOMY       Social History   Tobacco Use  . Smoking status: Never Smoker  . Smokeless tobacco: Never Used  Substance Use Topics  . Alcohol use: Yes    Alcohol/week: 0.0 oz    Comment: RARELY  . Drug use: No   Family History  Problem Relation Age of Onset  . Cancer Other        Prostate CA in older years  . Heart disease Mother        CAD, bypass  . Diabetes Mother   . Cancer Father        Lymphoma   Allergies  Allergen Reactions  . Shellfish Allergy     REACTION: dizzy and nauseated   Current Outpatient Medications on File Prior to Visit  Medication Sig Dispense Refill  . Ascorbic Acid (VITAMIN C) 100 MG tablet Take 100 mg by mouth once a week.     Marland Kitchen b complex vitamins tablet Take 1 tablet by mouth daily.    . diclofenac sodium (VOLTAREN) 1 % GEL Apply to large joint area as needed 2 Tube 2  . Ferrous Gluconate (IRON 27 PO) Take 1 tablet by mouth daily.    . Flaxseed, Linseed, (FLAX SEED OIL PO) Take by mouth 4 (four) times a week.    . magnesium 30 MG tablet Take 30 mg by mouth daily.     . Misc Natural Products (OSTEO BI-FLEX ADV DOUBLE ST PO) Take 1 tablet by mouth 2 (two) times daily.    . vitamin E 200 UNIT capsule Take 200 Units by mouth daily.     No current facility-administered medications on file prior to visit.     Review of Systems  Constitutional: Negative for activity change, appetite change, fatigue, fever and unexpected weight change.  HENT: Negative for congestion, rhinorrhea, sore throat and trouble swallowing.   Eyes: Negative for pain, redness, itching and visual disturbance.  Respiratory: Negative for cough, chest tightness, shortness of breath and wheezing.   Cardiovascular: Negative for chest pain and palpitations.  Gastrointestinal: Negative for abdominal pain, blood in stool, constipation, diarrhea and nausea.  Endocrine: Negative for cold intolerance, heat intolerance, polydipsia and polyuria.  Genitourinary: Negative for difficulty urinating, dysuria,  frequency and urgency.  Musculoskeletal: Negative for arthralgias, joint swelling and myalgias.  Skin: Negative for pallor and rash.  Neurological: Negative for dizziness, tremors, weakness, numbness and headaches.  Hematological: Negative for adenopathy. Does not bruise/bleed easily.  Psychiatric/Behavioral: Negative for decreased concentration and dysphoric mood. The patient is not nervous/anxious.        Objective:   Physical Exam  Constitutional: He appears well-developed and well-nourished. No distress.  Slim and well app   HENT:  Head: Normocephalic and atraumatic.  Right Ear: External ear normal.  Left Ear: External ear normal.  Nose: Nose normal.  Mouth/Throat: Oropharynx is clear and moist.  Eyes: Pupils are equal, round, and reactive to light. Conjunctivae and EOM are normal. Right eye exhibits no discharge. Left eye exhibits no discharge. No scleral icterus.  Neck: Normal range of motion. Neck supple. No JVD present. Carotid bruit is not present. No thyromegaly present.  Cardiovascular: Normal rate, regular rhythm, normal heart sounds and intact distal pulses. Exam reveals no gallop.  Pulmonary/Chest: Effort normal and breath sounds normal. No respiratory distress. He has no wheezes. He exhibits no tenderness.  Abdominal: Soft. Bowel sounds are normal. He exhibits no distension, no abdominal bruit and no mass. There is no tenderness.  Musculoskeletal: He exhibits no edema or tenderness.  Lymphadenopathy:    He has no cervical adenopathy.  Neurological: He is alert. He has normal reflexes. No cranial nerve deficit. He exhibits normal muscle tone. Coordination normal.  Skin: Skin is warm and dry. No rash noted. No erythema. No pallor.  Solar lentigines diffusely Few sks   Psychiatric: He has a normal mood and affect.  Pleasant           Assessment & Plan:   Problem List Items Addressed This Visit      Other   Colon cancer screening    Neg cologuard last year    Good for 3 y       Elevated glucose level    109 fasting  Slim/ runner-low risk for DM Rev diet  Will aim for more calories from protein-less from sugar Continue to monitor      Prostate cancer screening    Lab Results  Component Value Date   PSA 0.31 06/15/2017   PSA 0.31 06/09/2016   PSA 0.27 07/09/2015   No fam hx  No voiding symptoms or nocturia       Routine general medical examination at a health care facility - Primary    Reviewed health habits including diet and exercise and skin cancer prevention Reviewed appropriate screening tests for age  Also reviewed health mt list, fam hx and immunization status , as well as social and family history   See HPI Labs reviewed  Enc to keep up good health habits  Will watch glucose and alk phos levels

## 2017-06-20 NOTE — Assessment & Plan Note (Signed)
Lab Results  Component Value Date   PSA 0.31 06/15/2017   PSA 0.31 06/09/2016   PSA 0.27 07/09/2015   No fam hx  No voiding symptoms or nocturia

## 2017-06-20 NOTE — Assessment & Plan Note (Signed)
109 fasting  Slim/ runner-low risk for DM Rev diet  Will aim for more calories from protein-less from sugar Continue to monitor

## 2017-06-20 NOTE — Patient Instructions (Signed)
Keep up the good work with exercise and outdoor time   Try to eat healthy foods most of the time  Make sure you drink enough water   We will keep watching blood sugar and alk phos

## 2017-06-20 NOTE — Assessment & Plan Note (Signed)
Reviewed health habits including diet and exercise and skin cancer prevention Reviewed appropriate screening tests for age  Also reviewed health mt list, fam hx and immunization status , as well as social and family history   See HPI Labs reviewed  Enc to keep up good health habits  Will watch glucose and alk phos levels

## 2017-06-20 NOTE — Assessment & Plan Note (Signed)
Neg cologuard last year  Good for 3 y

## 2018-01-30 ENCOUNTER — Ambulatory Visit (INDEPENDENT_AMBULATORY_CARE_PROVIDER_SITE_OTHER): Payer: Self-pay

## 2018-01-30 ENCOUNTER — Ambulatory Visit (INDEPENDENT_AMBULATORY_CARE_PROVIDER_SITE_OTHER): Payer: BLUE CROSS/BLUE SHIELD | Admitting: Orthopaedic Surgery

## 2018-01-30 ENCOUNTER — Encounter (INDEPENDENT_AMBULATORY_CARE_PROVIDER_SITE_OTHER): Payer: Self-pay | Admitting: Orthopaedic Surgery

## 2018-01-30 VITALS — BP 112/76 | HR 73 | Ht 68.0 in | Wt 154.0 lb

## 2018-01-30 DIAGNOSIS — Z1283 Encounter for screening for malignant neoplasm of skin: Secondary | ICD-10-CM | POA: Diagnosis not present

## 2018-01-30 DIAGNOSIS — M25551 Pain in right hip: Secondary | ICD-10-CM

## 2018-01-30 DIAGNOSIS — M25562 Pain in left knee: Secondary | ICD-10-CM | POA: Diagnosis not present

## 2018-01-30 DIAGNOSIS — C44519 Basal cell carcinoma of skin of other part of trunk: Secondary | ICD-10-CM | POA: Diagnosis not present

## 2018-01-30 DIAGNOSIS — M25552 Pain in left hip: Secondary | ICD-10-CM | POA: Diagnosis not present

## 2018-01-30 DIAGNOSIS — D225 Melanocytic nevi of trunk: Secondary | ICD-10-CM | POA: Diagnosis not present

## 2018-01-30 DIAGNOSIS — D1801 Hemangioma of skin and subcutaneous tissue: Secondary | ICD-10-CM | POA: Diagnosis not present

## 2018-01-30 NOTE — Progress Notes (Signed)
Office Visit Note   Patient: Patrick Mcpherson           Date of Birth: 1960/04/09           MRN: [id number] Visit Date: 01/30/2018              Requested by: Tower, Audrie GallusMarne A, MD [address] [place], [place] [id number] PCP: Judy Pimpleower, Marne A, MD   Assessment & Plan: Visit Diagnoses:  1. Acute pain of left knee     Plan: Films demonstrate some medial compartment arthritis of the left knee which only could be responsible for his recurrent pain.  There is also a possibility of a chronic tear of the medial meniscus.  I think an MRI scan would be helpful.  I discussed this with him in detail and he like to proceed. Also has evidence of osteoarthritis of both hips with some decreased range of motion.  This may be responsible for his "hamstring" pain.  We will try physical therapy for evaluate and treat  Follow-Up Instructions: Return after MRI left knee.   Orders:  Orders Placed This Encounter  Procedures  . XR KNEE 3 VIEW LEFT   No orders of the defined types were placed in this encounter.     Procedures: No procedures performed   Clinical Data: No additional findings.   Subjective: Chief Complaint  Patient presents with  . Left Knee - Pain  Patrick Mcpherson has had an exacerbation of his left knee pain.  I saw him in April with medial joint pain and injected the knee with relief only to have it slowly recurred.  He is an avid runner but has not had any specific injury or trauma.  His pain is still localized on the medial compartment.  He is been Voltaren gel.  No instability.  No specific swelling. Also has been complaining of some pain in the area of his "hamstrings".  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  Recently he has had some pain along the proximal hamstrings.  Occasionally left some lateral hip pain.  No numbness or tingling or back discomfort  HPI  Review of Systems   Objective: Vital Signs: BP 112/76   Pulse 73   Ht 5'  8" (1.727 m)   Wt 154 lb (69.9 kg)   BMI 23.42 kg/m   Physical Exam Constitutional:      Appearance: He is well-developed.  Eyes:     Pupils: Pupils are equal, round, and reactive to light.  Pulmonary:     Effort: Pulmonary effort is normal.  Skin:    General: Skin is warm and dry.  Neurological:     Mental Status: He is alert and oriented to person, place, and time.  Psychiatric:        Behavior: Behavior normal.     Ortho Exam awake alert and oriented x3.  Comfortable sitting.  Mild anterior medial joint pain.  There is even some pain posteriorly.  No pain over the past anserine bursa.  No effusion.  No lateral or patellofemoral joint pain.  No calf discomfort.  No swelling distally.  Neurovascular exam intact.  Straight leg raise negative.   range of motion both hips some limitation on external rotation and pain at the extreme of internal and external rotation Specialty Comments:  No specialty comments available.  Imaging: No results found.   PMFS History: Patient Active Problem List   Diagnosis Date Noted  .  Elevated glucose level 06/20/2017  . Low back pain 06/17/2014  . Unspecified sleep apnea 03/28/2013  . Colon cancer screening 09/29/2011  . Snoring 12/02/2010  . Routine general medical examination at a health care facility 11/23/2010  . Prostate cancer screening 11/23/2010  . ANXIETY, SITUATIONAL 01/21/2010  . TINNITUS 01/21/2010   Past Medical History:  Diagnosis Date  . Anxiety    Stress reaction  . Hyperlipidemia    borderline    Family History  Problem Relation Age of Onset  . Cancer Other        Prostate CA in older years  . Heart disease Mother        CAD, bypass  . Diabetes Mother   . Cancer Father        Lymphoma    Past Surgical History:  Procedure Laterality Date  . RHINOPLASTY    . VASECTOMY     Social History   Occupational History  . Occupation: Event organiserilot    Employer: [employer]  Tobacco Use  . Smoking status: Never Smoker  .  Smokeless tobacco: Never Used  Substance and Sexual Activity  . Alcohol use: Yes    Alcohol/week: 0.0 standard drinks    Comment: RARELY  . Drug use: No  . Sexual activity: Not on file     Valeria BatmanPeter W Whitfield, MD   Note - This record has been created using [employer] software.  Chart creation errors have been sought, but may not always  have been located. Such creation errors do not reflect on  the standard of medical care.

## 2018-01-30 NOTE — Addendum Note (Signed)
Addended by: Mardene CelesteHUMPHREY, Melody Cirrincione B on: 01/30/2018 02:38 PM   Modules accepted: Orders

## 2018-01-30 NOTE — Progress Notes (Signed)
Pt stated Lt knee frontal/medial painful, worse when walking/running for about 9 months.

## 2018-02-08 DIAGNOSIS — M6281 Muscle weakness (generalized): Secondary | ICD-10-CM | POA: Diagnosis not present

## 2018-02-08 DIAGNOSIS — M25562 Pain in left knee: Secondary | ICD-10-CM | POA: Diagnosis not present

## 2018-02-08 DIAGNOSIS — M25552 Pain in left hip: Secondary | ICD-10-CM | POA: Diagnosis not present

## 2018-02-08 DIAGNOSIS — M25551 Pain in right hip: Secondary | ICD-10-CM | POA: Diagnosis not present

## 2018-02-10 DIAGNOSIS — M25562 Pain in left knee: Secondary | ICD-10-CM | POA: Diagnosis not present

## 2018-02-10 DIAGNOSIS — M25552 Pain in left hip: Secondary | ICD-10-CM | POA: Diagnosis not present

## 2018-02-10 DIAGNOSIS — M25551 Pain in right hip: Secondary | ICD-10-CM | POA: Diagnosis not present

## 2018-02-10 DIAGNOSIS — M6281 Muscle weakness (generalized): Secondary | ICD-10-CM | POA: Diagnosis not present

## 2018-02-12 ENCOUNTER — Ambulatory Visit
Admission: RE | Admit: 2018-02-12 | Discharge: 2018-02-12 | Disposition: A | Discharge status: Home / Self Care | Payer: BLUE CROSS/BLUE SHIELD | Source: Ambulatory Visit | Attending: Orthopaedic Surgery | Admitting: Orthopaedic Surgery

## 2018-02-12 DIAGNOSIS — M25562 Pain in left knee: Secondary | ICD-10-CM

## 2018-02-14 DIAGNOSIS — M25552 Pain in left hip: Secondary | ICD-10-CM | POA: Diagnosis not present

## 2018-02-14 DIAGNOSIS — M25551 Pain in right hip: Secondary | ICD-10-CM | POA: Diagnosis not present

## 2018-02-14 DIAGNOSIS — M6281 Muscle weakness (generalized): Secondary | ICD-10-CM | POA: Diagnosis not present

## 2018-02-14 DIAGNOSIS — M25562 Pain in left knee: Secondary | ICD-10-CM | POA: Diagnosis not present

## 2018-02-16 ENCOUNTER — Encounter (INDEPENDENT_AMBULATORY_CARE_PROVIDER_SITE_OTHER): Payer: Self-pay | Admitting: Orthopaedic Surgery

## 2018-02-16 ENCOUNTER — Ambulatory Visit (INDEPENDENT_AMBULATORY_CARE_PROVIDER_SITE_OTHER): Payer: BLUE CROSS/BLUE SHIELD | Admitting: Orthopaedic Surgery

## 2018-02-16 DIAGNOSIS — M25562 Pain in left knee: Secondary | ICD-10-CM | POA: Diagnosis not present

## 2018-02-16 DIAGNOSIS — M6281 Muscle weakness (generalized): Secondary | ICD-10-CM | POA: Diagnosis not present

## 2018-02-16 DIAGNOSIS — M25551 Pain in right hip: Secondary | ICD-10-CM | POA: Diagnosis not present

## 2018-02-16 DIAGNOSIS — M25552 Pain in left hip: Secondary | ICD-10-CM | POA: Diagnosis not present

## 2018-02-16 DIAGNOSIS — G8929 Other chronic pain: Secondary | ICD-10-CM

## 2018-02-16 NOTE — Progress Notes (Signed)
Office Visit Note   Patient: Patrick Mcpherson           Date of Birth: August 08, 1960           MRN: [id number] Visit Date: 02/16/2018              Requested by: Mcpherson, Patrick Gallus, MD [address] [place], [place] [address] PCP: Patrick Pimple, MD   Assessment & Plan: Visit Diagnoses:  1. Chronic pain of left knee     Plan: MRI scan of the left knee demonstrates complex tear of the body and posterior horn of the junction of the medial meniscus.  The tear extends into the posterior horn of the medial meniscus.  In addition there are areas of high-grade partial-thickness cartilage loss in the medial compartment areas of full-thickness cartilage loss with some subchondral reactive.  Long discussion with Patrick Mcpherson regarding the above.  He has 2 reasons for having the pain in the medial compartment including the meniscal tear and the arthritis.  After much discussion I think it be worthwhile to consider as the next step arthroscopy and debride the meniscus.  At some point if he continues to have a problem with the arthritis that he might be a good candidate for partial joint replacement for the arthritis.  He has a copy of his MRI scan.  He needs to think about the options and he will call  Follow-Up Instructions: Return if symptoms worsen or fail to improve.   Orders:  No orders of the defined types were placed in this encounter.  No orders of the defined types were placed in this encounter.     Procedures: No procedures performed   Clinical Data: No additional findings.   Subjective: Chief Complaint  Patient presents with  . Left Knee - Follow-up  . Results    MRI  Patrick Mcpherson returns for evaluation of his left knee pain.  He is had trouble now for well over a year.  He is an active runner.  He also is a Sales executive for [employer].  He sits for long periods of time and has had some discomfort with both of those activities.  Accordingly obtain an MRI scan  with the results as above.  HPI  Review of Systems   Objective: Vital Signs: There were no vitals taken for this visit.  Physical Exam Constitutional:      Appearance: He is well-developed.  Eyes:     Pupils: Pupils are equal, round, and reactive to light.  Pulmonary:     Effort: Pulmonary effort is normal.  Skin:    General: Skin is warm and dry.  Neurological:     Mental Status: He is alert and oriented to person, place, and time.  Psychiatric:        Behavior: Behavior normal.     Ortho Exam left knee with pain predominantly in the medial compartment more posterior than anterior.  No effusion.  No patella pain.  No lateral joint pain.  Full extension and flexes over 105 degrees without instability.  No popliteal mass.  No distal edema.  No groin pain or back discomfort  Specialty Comments:  No specialty comments available.  Imaging: No results found.   PMFS History: Patient Active Problem List   Diagnosis Date Noted  . Elevated glucose level 06/20/2017  . Low back pain 06/17/2014  . Unspecified sleep apnea 03/28/2013  . Colon cancer screening 09/29/2011  . Snoring 12/02/2010  .  Routine general medical examination at a health care facility 11/23/2010  . Prostate cancer screening 11/23/2010  . ANXIETY, SITUATIONAL 01/21/2010  . TINNITUS 01/21/2010   Past Medical History:  Diagnosis Date  . Anxiety    Stress reaction  . Hyperlipidemia    borderline    Family History  Problem Relation Age of Onset  . Cancer Other        Prostate CA in older years  . Heart disease Mother        CAD, bypass  . Diabetes Mother   . Cancer Father        Lymphoma    Past Surgical History:  Procedure Laterality Date  . RHINOPLASTY    . VASECTOMY     Social History   Occupational History  . Occupation: Event organiser: [employer]  Tobacco Use  . Smoking status: Never Smoker  . Smokeless tobacco: Never Used  Substance and Sexual Activity  . Alcohol use: Yes     Alcohol/week: 0.0 standard drinks    Comment: RARELY  . Drug use: No  . Sexual activity: Not on file     Valeria Batman, MD   Note - This record has been created using [employer].  Chart creation errors have been sought, but may not always  have been located. Such creation errors do not reflect on  the standard of medical care.

## 2018-02-20 ENCOUNTER — Ambulatory Visit (INDEPENDENT_AMBULATORY_CARE_PROVIDER_SITE_OTHER): Payer: BLUE CROSS/BLUE SHIELD | Admitting: Orthopaedic Surgery

## 2018-02-21 ENCOUNTER — Ambulatory Visit (INDEPENDENT_AMBULATORY_CARE_PROVIDER_SITE_OTHER): Payer: BLUE CROSS/BLUE SHIELD | Admitting: Orthopaedic Surgery

## 2018-02-22 DIAGNOSIS — H9312 Tinnitus, left ear: Secondary | ICD-10-CM | POA: Diagnosis not present

## 2018-02-22 DIAGNOSIS — M25551 Pain in right hip: Secondary | ICD-10-CM | POA: Diagnosis not present

## 2018-02-22 DIAGNOSIS — N50812 Left testicular pain: Secondary | ICD-10-CM | POA: Diagnosis not present

## 2018-02-22 DIAGNOSIS — M25552 Pain in left hip: Secondary | ICD-10-CM | POA: Diagnosis not present

## 2018-02-22 DIAGNOSIS — M6281 Muscle weakness (generalized): Secondary | ICD-10-CM | POA: Diagnosis not present

## 2018-02-22 DIAGNOSIS — H9042 Sensorineural hearing loss, unilateral, left ear, with unrestricted hearing on the contralateral side: Secondary | ICD-10-CM | POA: Diagnosis not present

## 2018-02-22 DIAGNOSIS — M25562 Pain in left knee: Secondary | ICD-10-CM | POA: Diagnosis not present

## 2018-02-24 ENCOUNTER — Other Ambulatory Visit: Payer: Self-pay | Admitting: Otolaryngology

## 2018-02-24 DIAGNOSIS — M25551 Pain in right hip: Secondary | ICD-10-CM | POA: Diagnosis not present

## 2018-02-24 DIAGNOSIS — M6281 Muscle weakness (generalized): Secondary | ICD-10-CM | POA: Diagnosis not present

## 2018-02-24 DIAGNOSIS — H9312 Tinnitus, left ear: Secondary | ICD-10-CM

## 2018-02-24 DIAGNOSIS — M25552 Pain in left hip: Secondary | ICD-10-CM | POA: Diagnosis not present

## 2018-02-24 DIAGNOSIS — H9042 Sensorineural hearing loss, unilateral, left ear, with unrestricted hearing on the contralateral side: Secondary | ICD-10-CM

## 2018-02-24 DIAGNOSIS — M25562 Pain in left knee: Secondary | ICD-10-CM | POA: Diagnosis not present

## 2018-02-28 DIAGNOSIS — M6281 Muscle weakness (generalized): Secondary | ICD-10-CM | POA: Diagnosis not present

## 2018-02-28 DIAGNOSIS — M25562 Pain in left knee: Secondary | ICD-10-CM | POA: Diagnosis not present

## 2018-02-28 DIAGNOSIS — M25551 Pain in right hip: Secondary | ICD-10-CM | POA: Diagnosis not present

## 2018-02-28 DIAGNOSIS — M25552 Pain in left hip: Secondary | ICD-10-CM | POA: Diagnosis not present

## 2018-03-01 DIAGNOSIS — N50812 Left testicular pain: Secondary | ICD-10-CM | POA: Diagnosis not present

## 2018-03-01 DIAGNOSIS — M62838 Other muscle spasm: Secondary | ICD-10-CM | POA: Diagnosis not present

## 2018-03-01 DIAGNOSIS — M6281 Muscle weakness (generalized): Secondary | ICD-10-CM | POA: Diagnosis not present

## 2018-03-01 DIAGNOSIS — R102 Pelvic and perineal pain: Secondary | ICD-10-CM | POA: Diagnosis not present

## 2018-03-03 DIAGNOSIS — M6281 Muscle weakness (generalized): Secondary | ICD-10-CM | POA: Diagnosis not present

## 2018-03-03 DIAGNOSIS — M25551 Pain in right hip: Secondary | ICD-10-CM | POA: Diagnosis not present

## 2018-03-03 DIAGNOSIS — M25562 Pain in left knee: Secondary | ICD-10-CM | POA: Diagnosis not present

## 2018-03-03 DIAGNOSIS — M25552 Pain in left hip: Secondary | ICD-10-CM | POA: Diagnosis not present

## 2018-03-04 ENCOUNTER — Ambulatory Visit
Admission: RE | Admit: 2018-03-04 | Discharge: 2018-03-04 | Disposition: A | Discharge status: Home / Self Care | Payer: BLUE CROSS/BLUE SHIELD | Source: Ambulatory Visit | Attending: Otolaryngology | Admitting: Otolaryngology

## 2018-03-04 DIAGNOSIS — H9312 Tinnitus, left ear: Secondary | ICD-10-CM

## 2018-03-04 DIAGNOSIS — H9042 Sensorineural hearing loss, unilateral, left ear, with unrestricted hearing on the contralateral side: Secondary | ICD-10-CM

## 2018-03-04 MED ORDER — GADOBENATE DIMEGLUMINE 529 MG/ML IV SOLN
15.0000 mL | Freq: Once | INTRAVENOUS | Status: AC | PRN
  Administered 2018-03-04: 15 mL via INTRAVENOUS

## 2018-03-06 DIAGNOSIS — R102 Pelvic and perineal pain: Secondary | ICD-10-CM | POA: Diagnosis not present

## 2018-03-06 DIAGNOSIS — M25551 Pain in right hip: Secondary | ICD-10-CM | POA: Diagnosis not present

## 2018-03-06 DIAGNOSIS — M6281 Muscle weakness (generalized): Secondary | ICD-10-CM | POA: Diagnosis not present

## 2018-03-06 DIAGNOSIS — M25562 Pain in left knee: Secondary | ICD-10-CM | POA: Diagnosis not present

## 2018-03-06 DIAGNOSIS — M62838 Other muscle spasm: Secondary | ICD-10-CM | POA: Diagnosis not present

## 2018-03-06 DIAGNOSIS — N50812 Left testicular pain: Secondary | ICD-10-CM | POA: Diagnosis not present

## 2018-03-06 DIAGNOSIS — M25552 Pain in left hip: Secondary | ICD-10-CM | POA: Diagnosis not present

## 2018-03-10 DIAGNOSIS — M25551 Pain in right hip: Secondary | ICD-10-CM | POA: Diagnosis not present

## 2018-03-10 DIAGNOSIS — M25562 Pain in left knee: Secondary | ICD-10-CM | POA: Diagnosis not present

## 2018-03-10 DIAGNOSIS — M25552 Pain in left hip: Secondary | ICD-10-CM | POA: Diagnosis not present

## 2018-03-10 DIAGNOSIS — M6281 Muscle weakness (generalized): Secondary | ICD-10-CM | POA: Diagnosis not present

## 2018-03-15 DIAGNOSIS — M25552 Pain in left hip: Secondary | ICD-10-CM | POA: Diagnosis not present

## 2018-03-15 DIAGNOSIS — M25562 Pain in left knee: Secondary | ICD-10-CM | POA: Diagnosis not present

## 2018-03-15 DIAGNOSIS — R102 Pelvic and perineal pain: Secondary | ICD-10-CM | POA: Diagnosis not present

## 2018-03-15 DIAGNOSIS — N50812 Left testicular pain: Secondary | ICD-10-CM | POA: Diagnosis not present

## 2018-03-15 DIAGNOSIS — M6281 Muscle weakness (generalized): Secondary | ICD-10-CM | POA: Diagnosis not present

## 2018-03-15 DIAGNOSIS — M25551 Pain in right hip: Secondary | ICD-10-CM | POA: Diagnosis not present

## 2018-03-15 DIAGNOSIS — M62838 Other muscle spasm: Secondary | ICD-10-CM | POA: Diagnosis not present

## 2018-03-28 DIAGNOSIS — M6281 Muscle weakness (generalized): Secondary | ICD-10-CM | POA: Diagnosis not present

## 2018-03-28 DIAGNOSIS — M25552 Pain in left hip: Secondary | ICD-10-CM | POA: Diagnosis not present

## 2018-03-28 DIAGNOSIS — M25551 Pain in right hip: Secondary | ICD-10-CM | POA: Diagnosis not present

## 2018-03-28 DIAGNOSIS — M25562 Pain in left knee: Secondary | ICD-10-CM | POA: Diagnosis not present

## 2018-03-29 DIAGNOSIS — Z85828 Personal history of other malignant neoplasm of skin: Secondary | ICD-10-CM | POA: Diagnosis not present

## 2018-03-29 DIAGNOSIS — Z08 Encounter for follow-up examination after completed treatment for malignant neoplasm: Secondary | ICD-10-CM | POA: Diagnosis not present

## 2018-03-29 DIAGNOSIS — M6281 Muscle weakness (generalized): Secondary | ICD-10-CM | POA: Diagnosis not present

## 2018-03-29 DIAGNOSIS — R102 Pelvic and perineal pain: Secondary | ICD-10-CM | POA: Diagnosis not present

## 2018-03-29 DIAGNOSIS — M62838 Other muscle spasm: Secondary | ICD-10-CM | POA: Diagnosis not present

## 2018-03-29 DIAGNOSIS — N50812 Left testicular pain: Secondary | ICD-10-CM | POA: Diagnosis not present

## 2018-03-29 DIAGNOSIS — H40003 Preglaucoma, unspecified, bilateral: Secondary | ICD-10-CM | POA: Diagnosis not present

## 2018-04-05 DIAGNOSIS — M25552 Pain in left hip: Secondary | ICD-10-CM | POA: Diagnosis not present

## 2018-04-05 DIAGNOSIS — M25551 Pain in right hip: Secondary | ICD-10-CM | POA: Diagnosis not present

## 2018-04-05 DIAGNOSIS — M6281 Muscle weakness (generalized): Secondary | ICD-10-CM | POA: Diagnosis not present

## 2018-04-05 DIAGNOSIS — M25562 Pain in left knee: Secondary | ICD-10-CM | POA: Diagnosis not present

## 2018-04-12 DIAGNOSIS — M6281 Muscle weakness (generalized): Secondary | ICD-10-CM | POA: Diagnosis not present

## 2018-04-12 DIAGNOSIS — R102 Pelvic and perineal pain: Secondary | ICD-10-CM | POA: Diagnosis not present

## 2018-04-12 DIAGNOSIS — M62838 Other muscle spasm: Secondary | ICD-10-CM | POA: Diagnosis not present

## 2018-04-12 DIAGNOSIS — N50812 Left testicular pain: Secondary | ICD-10-CM | POA: Diagnosis not present

## 2018-06-15 ENCOUNTER — Telehealth: Payer: Self-pay | Admitting: Family Medicine

## 2018-06-15 NOTE — Telephone Encounter (Signed)
I left a message on patient's voice mail that as long as he's comfortable with it, we can see him in office for his cpx on 06/23/18.  I asked him to r/s 06/20/18 lab appointment to later than 7:45 and I let him know we'd let him know how to do curbside lab.

## 2018-06-19 ENCOUNTER — Telehealth: Payer: Self-pay | Admitting: Family Medicine

## 2018-06-19 DIAGNOSIS — R7309 Other abnormal glucose: Secondary | ICD-10-CM

## 2018-06-19 DIAGNOSIS — Z125 Encounter for screening for malignant neoplasm of prostate: Secondary | ICD-10-CM

## 2018-06-19 DIAGNOSIS — Z0000 Encounter for general adult medical examination without abnormal findings: Secondary | ICD-10-CM

## 2018-06-19 NOTE — Telephone Encounter (Signed)
-----   Message from Aquilla Solian, RT sent at 06/16/2018 12:55 PM EDT ----- Regarding: Lab Orders for Tuesday 5.19.2020 Please place lab orders for Tuesday 5.19.2020, Doxy[employer]me on Friday 5.22.2020 Thank you, Jones Bales RT(R)

## 2018-06-20 ENCOUNTER — Other Ambulatory Visit (INDEPENDENT_AMBULATORY_CARE_PROVIDER_SITE_OTHER): Payer: BLUE CROSS/BLUE SHIELD

## 2018-06-20 ENCOUNTER — Other Ambulatory Visit: Payer: Self-pay

## 2018-06-20 ENCOUNTER — Other Ambulatory Visit: Payer: BLUE CROSS/BLUE SHIELD

## 2018-06-20 DIAGNOSIS — R7309 Other abnormal glucose: Secondary | ICD-10-CM | POA: Diagnosis not present

## 2018-06-20 DIAGNOSIS — Z0000 Encounter for general adult medical examination without abnormal findings: Secondary | ICD-10-CM | POA: Diagnosis not present

## 2018-06-20 DIAGNOSIS — Z125 Encounter for screening for malignant neoplasm of prostate: Secondary | ICD-10-CM

## 2018-06-20 LAB — CBC WITH DIFFERENTIAL/PLATELET
Basophils Absolute: 0 10*3/uL (ref 0.0–0.1)
Basophils Relative: 1 % (ref 0.0–3.0)
Eosinophils Absolute: 0.1 10*3/uL (ref 0.0–0.7)
Eosinophils Relative: 3.7 % (ref 0.0–5.0)
HCT: 42.8 % (ref 39.0–52.0)
Hemoglobin: 14.8 g/dL (ref 13.0–17.0)
Lymphocytes Relative: 37.2 % (ref 12.0–46.0)
Lymphs Abs: 1.3 10*3/uL (ref 0.7–4.0)
MCHC: 34.5 g/dL (ref 30.0–36.0)
MCV: 94.2 fl (ref 78.0–100.0)
Monocytes Absolute: 0.5 10*3/uL (ref 0.1–1.0)
Monocytes Relative: 12.9 % — ABNORMAL HIGH (ref 3.0–12.0)
Neutro Abs: 1.6 10*3/uL (ref 1.4–7.7)
Neutrophils Relative %: 45.2 % (ref 43.0–77.0)
Platelets: 246 10*3/uL (ref 150.0–400.0)
RBC: 4.54 Mil/uL (ref 4.22–5.81)
RDW: 13.4 % (ref 11.5–15.5)
WBC: 3.5 10*3/uL — ABNORMAL LOW (ref 4.0–10.5)

## 2018-06-20 LAB — LIPID PANEL
Cholesterol: 179 mg/dL (ref 0–200)
HDL: 68.3 mg/dL (ref >39.00)
LDL Cholesterol: 100 mg/dL — ABNORMAL HIGH (ref 0–99)
NonHDL: 110.87
Total CHOL/HDL Ratio: 3
Triglycerides: 55 mg/dL (ref 0.0–149.0)
VLDL: 11 mg/dL (ref 0.0–40.0)

## 2018-06-20 LAB — COMPREHENSIVE METABOLIC PANEL
ALT: 15 U/L (ref 0–53)
AST: 17 U/L (ref 0–37)
Albumin: 4.1 g/dL (ref 3.5–5.2)
Alkaline Phosphatase: 101 U/L (ref 39–117)
BUN: 14 mg/dL (ref 6–23)
CO2: 32 mEq/L (ref 19–32)
Calcium: 8.8 mg/dL (ref 8.4–10.5)
Chloride: 101 mEq/L (ref 96–112)
Creatinine, Ser: 0.91 mg/dL (ref 0.40–1.50)
GFR: 85.15 mL/min (ref >60.00)
Glucose, Bld: 95 mg/dL (ref 70–99)
Potassium: 4 mEq/L (ref 3.5–5.1)
Sodium: 138 mEq/L (ref 135–145)
Total Bilirubin: 0.8 mg/dL (ref 0.2–1.2)
Total Protein: 6.2 g/dL (ref 6.0–8.3)

## 2018-06-20 LAB — TSH: TSH: 2.64 u[IU]/mL (ref 0.35–4.50)

## 2018-06-20 LAB — PSA: PSA: 0.25 ng/mL (ref 0.10–4.00)

## 2018-06-20 LAB — HEMOGLOBIN A1C: Hgb A1c MFr Bld: 5.7 % (ref 4.6–6.5)

## 2018-06-23 ENCOUNTER — Ambulatory Visit (INDEPENDENT_AMBULATORY_CARE_PROVIDER_SITE_OTHER): Payer: BLUE CROSS/BLUE SHIELD | Admitting: Family Medicine

## 2018-06-23 ENCOUNTER — Encounter: Payer: Self-pay | Admitting: Family Medicine

## 2018-06-23 ENCOUNTER — Encounter: Payer: BLUE CROSS/BLUE SHIELD | Admitting: Family Medicine

## 2018-06-23 DIAGNOSIS — Z125 Encounter for screening for malignant neoplasm of prostate: Secondary | ICD-10-CM | POA: Diagnosis not present

## 2018-06-23 DIAGNOSIS — Z0000 Encounter for general adult medical examination without abnormal findings: Secondary | ICD-10-CM

## 2018-06-23 DIAGNOSIS — Z1211 Encounter for screening for malignant neoplasm of colon: Secondary | ICD-10-CM | POA: Diagnosis not present

## 2018-06-23 DIAGNOSIS — R7309 Other abnormal glucose: Secondary | ICD-10-CM | POA: Diagnosis not present

## 2018-06-23 DIAGNOSIS — G5711 Meralgia paresthetica, right lower limb: Secondary | ICD-10-CM | POA: Insufficient documentation

## 2018-06-23 NOTE — Progress Notes (Signed)
Virtual Visit via Video Note  I connected with Patrick Mcpherson on 06/23/18 at  8:00 AM EDT by a video enabled telemedicine application and verified that I am speaking with the correct person using two identifiers.  Location: Patient: home Provider: office    I discussed the limitations of evaluation and management by telemedicine and the availability of in person appointments. The patient expressed understanding and agreed to proceed.  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   Has been doing PT for hamstring issue  Lots of exercises    Wt Readings from Last 3 Encounters:  01/30/18 154 lb (69.9 kg)  06/20/17 149 lb (67.6 kg)  06/14/17 150 lb (68 kg)  last bmi 22.6 Does not weigh at home-thinks he is around 155 lb (no gain or loss)  Pulse avg 59   Flu vaccine - did not get   Tetanus vaccine 9/14  cologuard 6/18-negative   Prostate health  Lab Results  Component Value Date   PSA 0.25 06/20/2018   PSA 0.31 06/15/2017   PSA 0.31 06/09/2016  no problems with urination at all No nocturia No fam hx     H/o elevated glucose level in the past Lab Results  Component Value Date   HGBA1C 5.7 06/20/2018  glucose was 95   Cholesterol Lab Results  Component Value Date   CHOL 179 06/20/2018   CHOL 180 06/15/2017   CHOL 200 06/09/2016   Lab Results  Component Value Date   HDL 68.30 06/20/2018   HDL 78.80 06/15/2017   HDL 77.30 06/09/2016   Lab Results  Component Value Date   LDLCALC 100 (H) 06/20/2018   LDLCALC 90 06/15/2017   LDLCALC 111 (H) 06/09/2016   Lab Results   Component Value Date   TRIG 55.0 06/20/2018   TRIG 56.0 06/15/2017   TRIG 59.0 06/09/2016   Lab Results  Component Value Date   CHOLHDL 3 06/20/2018   CHOLHDL 2 06/15/2017   CHOLHDL 3 06/09/2016   Lab Results  Component Value Date   LDLDIRECT 120.9 11/24/2010   LDLDIRECT 132.0 01/14/2010  slight increase in LDL and dec in HDL   Sleep apnea  His last home test- very low on events - good  He wakes up frequently   fam hx  Father had lymphoma   Other labs  Lab Results  Component Value Date   WBC 3.5 (L) 06/20/2018   HGB 14.8 06/20/2018   HCT 42.8 06/20/2018   MCV 94.2 06/20/2018   PLT 246.0 06/20/2018   Lab Results  Component Value Date   CREATININE 0.91 06/20/2018   BUN 14 06/20/2018   NA 138 06/20/2018   K 4.0 06/20/2018   CL 101 06/20/2018   CO2 32 06/20/2018   Lab Results  Component Value Date   ALT 15 06/20/2018   AST 17 06/20/2018   ALKPHOS 101 06/20/2018   BILITOT 0.8 06/20/2018    Lab Results  Component Value Date   TSH 2.64 06/20/2018    He started taking meloxicam on Tuesday -not  helping yet /it helps other things (trying for 30 -60 days)  He sees Dr Annabell Howells urology -seen early this year due to discomfort in one testicle ever since his vasectomy  Recommended pelvic PT for him    Review of Systems  Constitutional: Negative for chills, fever, malaise/fatigue and weight loss.  HENT: Negative for hearing loss.   Eyes: Negative for blurred vision, discharge and redness.  Respiratory: Negative for cough and shortness of breath.   Cardiovascular: Negative for chest pain, palpitations, claudication, leg swelling and PND.  Gastrointestinal: Negative for abdominal pain, blood in stool, heartburn and nausea.  Genitourinary: Negative for frequency and urgency.  Musculoskeletal: Negative for back pain and joint pain.  Skin: Negative for rash.  Neurological: Negative for dizziness and headaches.  Psychiatric/Behavioral: Negative for depression. The patient  is not nervous/anxious.     Patient Active Problem List   Diagnosis Date Noted  . Meralgia paraesthetica, right 06/23/2018  . Elevated glucose level 06/20/2017  . Low back pain 06/17/2014  . Unspecified sleep apnea 03/28/2013  . Colon cancer screening 09/29/2011  . Snoring 12/02/2010  . Routine general medical examination at a health care facility 11/23/2010  . Prostate cancer screening 11/23/2010  . ANXIETY, SITUATIONAL 01/21/2010  . TINNITUS 01/21/2010   Past Medical History:  Diagnosis Date  . Anxiety    Stress reaction  . Hyperlipidemia    borderline   Past Surgical History:  Procedure Laterality Date  . RHINOPLASTY    . VASECTOMY     Social History   Tobacco Use  . Smoking status: Never Smoker  . Smokeless tobacco: Never Used  Substance Use Topics  . Alcohol use: Yes    Alcohol/week: 0.0 standard drinks    Comment: RARELY  . Drug use: No   Family History  Problem Relation Age of Onset  . Cancer Other        Prostate CA in older years  . Heart disease Mother        CAD, bypass  . Diabetes Mother   . Cancer Father        Lymphoma   Allergies  Allergen Reactions  . Shellfish Allergy     REACTION: dizzy and nauseated   Current Outpatient Medications on File Prior to Visit  Medication Sig Dispense Refill  . Ascorbic Acid (VITAMIN C) 100 MG tablet Take 100 mg by mouth once a week.     Marland Kitchen b complex vitamins tablet Take 1 tablet by mouth daily.    . diclofenac sodium (VOLTAREN) 1 % GEL Apply to large joint area as needed 2 Tube 2  . Flaxseed, Linseed, (FLAX SEED OIL PO) Take by mouth 4 (four) times a week.    . Misc Natural Products (OSTEO BI-FLEX ADV DOUBLE ST PO) Take 1 tablet by mouth 2 (two) times daily.     No current facility-administered medications on file prior to visit.     Observations/Objective: Patient appears well, in no distress Weight is baseline  No facial swelling or asymmetry Normal voice-not hoarse and no slurred speech No obvious  tremor or mobility impairment Moving neck and UEs normally Able to hear the call well  No cough or shortness of breath during interview  Talkative and mentally sharp with no cognitive changes No skin changes on face or neck , no rash or pallor Affect is normal    Assessment and Plan: Problem List Items Addressed This Visit      Nervous and Auditory   Meralgia paraesthetica, right  Suspect this given symptoms in athelete  Disc use of ice  Relative rest if needed Consider sport med visit if needed in the future        Other   Routine general medical examination at a health care facility    Reviewed health habits including diet and exercise and skin cancer prevention Reviewed appropriate screening tests for age  Also reviewed health mt list, fam hx and immunization status , as well as social and family history   See HPI Labs reviewed       Prostate cancer screening    Lab Results  Component Value Date   PSA 0.25 06/20/2018   PSA 0.31 06/15/2017   PSA 0.31 06/09/2016   No symptoms       Colon cancer screening - Primary    Neg cologuard test 6/18  utd No stool changes       Elevated glucose level    Lab Results  Component Value Date   HGBA1C 5.7 06/20/2018   disc imp of low glycemic diet and wt loss to prevent DM2           Follow Up Instructions:   You may have lateral femoral cutaneous nerve syndrome  If symptoms worsen we can arrange a visit to sports medicine   Keep up good health habits  Labs are ok  Get outdoors when able and use sunscreen  I discussed the assessment and treatment plan with the patient. The patient was provided an opportunity to ask questions and all were answered. The patient agreed with the plan and demonstrated an understanding of the instructions.   The patient was advised to call back or seek an in-person evaluation if the symptoms worsen or if the condition fails to improve as anticipated.     Roxy MannsMarne Betsi Crespi, MD

## 2018-06-25 NOTE — Assessment & Plan Note (Signed)
Neg cologuard test 6/18  utd No stool changes

## 2018-06-25 NOTE — Assessment & Plan Note (Signed)
Lab Results  Component Value Date   HGBA1C 5.7 06/20/2018   disc imp of low glycemic diet and wt loss to prevent DM2

## 2018-06-25 NOTE — Assessment & Plan Note (Signed)
Reviewed health habits including diet and exercise and skin cancer prevention Reviewed appropriate screening tests for age  Also reviewed health mt list, fam hx and immunization status , as well as social and family history   See HPI Labs reviewed  

## 2018-06-25 NOTE — Patient Instructions (Signed)
You may have lateral femoral cutaneous nerve syndrome  If symptoms worsen we can arrange a visit to sports medicine   Keep up good health habits  Labs are ok  Get outdoors when able and use sunscreen

## 2018-06-25 NOTE — Assessment & Plan Note (Signed)
Suspect this given symptoms in athelete  Disc use of ice  Relative rest if needed Consider sport med visit if needed in the future

## 2018-06-25 NOTE — Assessment & Plan Note (Signed)
Lab Results  Component Value Date   PSA 0.25 06/20/2018   PSA 0.31 06/15/2017   PSA 0.31 06/09/2016   No symptoms

## 2018-09-28 DIAGNOSIS — H40003 Preglaucoma, unspecified, bilateral: Secondary | ICD-10-CM | POA: Diagnosis not present

## 2019-01-24 ENCOUNTER — Encounter: Payer: Self-pay | Admitting: Family Medicine

## 2019-01-24 ENCOUNTER — Other Ambulatory Visit: Payer: Self-pay

## 2019-01-24 ENCOUNTER — Ambulatory Visit (INDEPENDENT_AMBULATORY_CARE_PROVIDER_SITE_OTHER): Payer: BC Managed Care – PPO | Admitting: Family Medicine

## 2019-01-24 ENCOUNTER — Ambulatory Visit (INDEPENDENT_AMBULATORY_CARE_PROVIDER_SITE_OTHER)
Admission: RE | Admit: 2019-01-24 | Discharge: 2019-01-24 | Disposition: A | Discharge status: Home / Self Care | Payer: BC Managed Care – PPO | Source: Ambulatory Visit | Attending: Family Medicine | Admitting: Family Medicine

## 2019-01-24 VITALS — BP 106/78 | HR 72 | Temp 97.8°F | Ht 68.0 in | Wt 156.3 lb

## 2019-01-24 DIAGNOSIS — M545 Low back pain: Secondary | ICD-10-CM | POA: Diagnosis not present

## 2019-01-24 DIAGNOSIS — M5416 Radiculopathy, lumbar region: Secondary | ICD-10-CM

## 2019-01-24 MED ORDER — METHOCARBAMOL 500 MG PO TABS: 500.0000 mg | ORAL_TABLET | Freq: Three times a day (TID) | ORAL | 1 refills | Status: DC | PRN

## 2019-01-24 MED ORDER — PREDNISONE 10 MG PO TABS: ORAL_TABLET | ORAL | 0 refills | Status: DC

## 2019-01-24 NOTE — Patient Instructions (Addendum)
Use heat or ice  Walk  Stretch - take a look at the handout  Take prednisone (hyper, hungry)   Try the muscle relaxer (methocarbamol)   Xray today   Further plan from there

## 2019-01-24 NOTE — Progress Notes (Signed)
Subjective:    Patient ID: Patrick Mcpherson, male    DOB: 1960/06/24, 58 y.o.   MRN: [id number]  This visit occurred during the SARS-CoV-2 public health emergency.  Safety protocols were in place, including screening questions prior to the visit, additional usage of staff PPE, and extensive cleaning of exam room while observing appropriate contact time as indicated for disinfecting solutions.    HPI  Pt presents with L hip pain /buttock and back    Wt Readings from Last 3 Encounters:  01/24/19 156 lb 5 oz (70.9 kg)  01/30/18 154 lb (69.9 kg)  06/20/17 149 lb (67.6 kg)   23.77 kg/m   Pain is on the L side starting at his L upper buttock and radiates down lateral leg  Occ into groin  At times some n/t-- L foot feels funny  No loss of strength or foot drop     This started after power walking for 1.5 miles   - flared it  First time was after line dancing in the summer (took about 3 weeks go get over it)  Years ago he did fall on a bike on his L side  athelete  Has had IT band issues and tightness     Comes and goes  Has worked with chiropractor (has a tight SI joint)  Suggested imaging - xr and MRI   Last lumbar film DG Lumbar Spine Complete (Accession [id number]) (Order [id number]) Imaging Date: 06/20/2014 Department: [facility] Released By: Liane Comber C Authorizing: Deatra Mcmahen, Audrie Gallus, MD  Exam Status  Status  Final [99]  PACS Intelerad Image Link  Show images for DG Lumbar Spine Complete  Study Result  CLINICAL DATA:  Low back pain without sciatica. Acute and chronic. No recent injury.  EXAM: LUMBAR SPINE - COMPLETE 4+ VIEW  COMPARISON:  None.  FINDINGS: There are 5 non rib-bearing lumbar type vertebral bodies.  There is a mild scoliotic curvature of the thoracolumbar spine with dominant cranial component convex to the right. No anterolisthesis or retrolisthesis.  Lumbar vertebral body heights are  preserved.  There is mild DDD of L5-S1 with disc space height loss, endplate irregularity and sclerosis. Remaining intervertebral disc space heights appear preserved.  Limited visualization the bilateral SI joints is normal.  Regional bowel gas pattern and soft tissues are normal.  IMPRESSION: 1. No acute findings. 2. Mild DDD of L5-S1.   Electronically Signed   By: Simonne Come M.D.   On: 06/20/2014 22:43   For 3 days he laid on couch- used ice  Has seen chiropractor and rolfer  Sitting is worst  Standing is not as bad  Walking is a little better -once he gets going   Has a little scoliosis   Also chronic testicular pain from a vasectomy  Otc: meloxicam  Ibuprofen  Topical voltaren Acetaminophen  Ice feels better than heat   He does hamstring and piriformis stretches   No athletic competitions this year   Has a torn meniscus of the L knee - deals with it/did PT This affects his gait   Has never done PT for spine   Patient Active Problem List   Diagnosis Date Noted  . Meralgia paraesthetica, right 06/23/2018  . Elevated glucose level 06/20/2017  . Lumbar back pain with radiculopathy affecting left lower extremity 06/17/2014  . Unspecified sleep apnea 03/28/2013  . Colon cancer screening 09/29/2011  . Snoring 12/02/2010  . Routine general medical examination at a health care facility  11/23/2010  . Prostate cancer screening 11/23/2010  . ANXIETY, SITUATIONAL 01/21/2010  . TINNITUS 01/21/2010   Past Medical History:  Diagnosis Date  . Anxiety    Stress reaction  . Hyperlipidemia    borderline   Past Surgical History:  Procedure Laterality Date  . RHINOPLASTY    . VASECTOMY     Social History   Tobacco Use  . Smoking status: Never Smoker  . Smokeless tobacco: Never Used  Substance Use Topics  . Alcohol use: Yes    Alcohol/week: 0.0 standard drinks    Comment: RARELY  . Drug use: No   Family History  Problem Relation Age of Onset  .  Cancer Other        Prostate CA in older years  . Heart disease Mother        CAD, bypass  . Diabetes Mother   . Cancer Father        Lymphoma   Allergies  Allergen Reactions  . Shellfish Allergy     REACTION: dizzy and nauseated   Current Outpatient Medications on File Prior to Visit  Medication Sig Dispense Refill  . Ascorbic Acid (VITAMIN C) 100 MG tablet Take 100 mg by mouth once a week.     Marland Kitchen. b complex vitamins tablet Take 1 tablet by mouth daily.    . diclofenac sodium (VOLTAREN) 1 % GEL Apply to large joint area as needed 2 Tube 2  . Flaxseed, Linseed, (FLAX SEED OIL PO) Take by mouth 4 (four) times a week.    . Misc Natural Products (OSTEO BI-FLEX ADV DOUBLE ST PO) Take 1 tablet by mouth 2 (two) times daily.     No current facility-administered medications on file prior to visit.     Review of Systems  Constitutional: Negative for activity change, appetite change, fatigue, fever and unexpected weight change.  HENT: Negative for congestion, rhinorrhea, sore throat and trouble swallowing.   Eyes: Negative for pain, redness, itching and visual disturbance.  Respiratory: Negative for cough, chest tightness, shortness of breath and wheezing.   Cardiovascular: Negative for chest pain and palpitations.  Gastrointestinal: Negative for abdominal pain, blood in stool, constipation, diarrhea and nausea.  Endocrine: Negative for cold intolerance, heat intolerance, polydipsia and polyuria.  Genitourinary: Negative for difficulty urinating, dysuria, frequency and urgency.  Musculoskeletal: Positive for arthralgias and back pain. Negative for joint swelling and myalgias.  Skin: Negative for pallor and rash.  Neurological: Negative for dizziness, tremors, weakness, numbness and headaches.  Hematological: Negative for adenopathy. Does not bruise/bleed easily.  Psychiatric/Behavioral: Negative for decreased concentration and dysphoric mood. The patient is not nervous/anxious.         Objective:   Physical Exam Constitutional:      General: He is not in acute distress.    Appearance: Normal appearance. He is well-developed and normal weight. He is not ill-appearing.  HENT:     Head: Normocephalic and atraumatic.  Eyes:     General: No scleral icterus.    Conjunctiva/sclera: Conjunctivae normal.     Pupils: Pupils are equal, round, and reactive to light.  Cardiovascular:     Rate and Rhythm: Normal rate and regular rhythm.  Pulmonary:     Effort: Pulmonary effort is normal.     Breath sounds: Normal breath sounds. No wheezing or rales.  Abdominal:     General: Bowel sounds are normal. There is no distension.     Palpations: Abdomen is soft.     Tenderness: There is no  abdominal tenderness.  Musculoskeletal:        General: Tenderness present.     Cervical back: Normal range of motion and neck supple.     Lumbar back: Spasms and tenderness present. No swelling, edema, deformity or bony tenderness. Decreased range of motion. No scoliosis.     Comments: No LS bony tenderness Tender in L lumbar musculature and piriformis area  SI joint on L is slightly tender Tight with int rot of L hip  Pain with L lateral bend    Lymphadenopathy:     Cervical: No cervical adenopathy.  Skin:    General: Skin is warm and dry.     Coloration: Skin is not pale.     Findings: No erythema or rash.  Neurological:     Mental Status: He is alert.     Cranial Nerves: No cranial nerve deficit.     Sensory: No sensory deficit.     Motor: No atrophy or abnormal muscle tone.     Coordination: Coordination normal.     Gait: Gait is intact.     Deep Tendon Reflexes: Reflexes are normal and symmetric.     Comments: SLR on L causes L buttock pain   No neuro deficits or changes  Nl gait   Psychiatric:        Mood and Affect: Mood normal.           Assessment & Plan:   Problem List Items Addressed This Visit      Nervous and Auditory   Lumbar back pain with radiculopathy  affecting left lower extremity - Primary    Known DDD from prev xray  Recurrent pain in L LS/lat hip/piriformis area  Sees chiropractor Is an athelete- just walking now Trial of prednisone taper today  Also methocarbamol with caution of sedation Rev rehab exercises for home  Xray of LS now  Pt is interested in MRI if no imp  I may consider PT as well He will update       Relevant Medications   predniSONE (DELTASONE) 10 MG tablet   methocarbamol (ROBAXIN) 500 MG tablet   Other Relevant Orders   DG Lumbar Spine Complete (Completed)

## 2019-01-25 NOTE — Assessment & Plan Note (Signed)
Known DDD from prev xray  Recurrent pain in L LS/lat hip/piriformis area  Sees chiropractor Is an athelete- just walking now Trial of prednisone taper today  Also methocarbamol with caution of sedation Rev rehab exercises for home  Xray of LS now  Pt is interested in MRI if no imp  I may consider PT as well He will update

## 2019-02-05 ENCOUNTER — Telehealth: Payer: Self-pay | Admitting: Family Medicine

## 2019-02-05 DIAGNOSIS — M5416 Radiculopathy, lumbar region: Secondary | ICD-10-CM

## 2019-02-05 NOTE — Telephone Encounter (Signed)
Pt said he finished prednisone on 02/03/18; pt cannot see a lot of improvement from after taking prednisone and methocarbamol. Pt has used ice and ibuprofen today and pain level when sits is 7-8. Back pain radiates down lt leg and there is numbness and tingling in lt leg to knee. Pt request what is next step and request cb. [facility].

## 2019-02-05 NOTE — Telephone Encounter (Signed)
Patient is requesting a call back from the nurse  He stated that he would like to discuss the back pain that he seen Dr Milinda Antis for before [place]. Patient stated that it does not seem to be improving

## 2019-02-05 NOTE — Telephone Encounter (Signed)
Pt agrees with order for MRI. Please put referral in and I advise pt our [facility] will call to schedule appt. Pt said he will just take OTC tylenol and use diclofenac gel as needed because the muscle relaxer didn't help

## 2019-02-05 NOTE — Telephone Encounter (Signed)
Routing to Triage nurse to triage pain

## 2019-02-05 NOTE — Telephone Encounter (Signed)
Referral done Will route to [facility]  

## 2019-02-05 NOTE — Telephone Encounter (Signed)
If insurance covers -I would like to order lumbar MRI  Is he ok with that ?

## 2019-02-07 ENCOUNTER — Encounter: Payer: Self-pay | Admitting: Orthopaedic Surgery

## 2019-02-07 ENCOUNTER — Telehealth: Payer: Self-pay

## 2019-02-07 ENCOUNTER — Ambulatory Visit (INDEPENDENT_AMBULATORY_CARE_PROVIDER_SITE_OTHER): Payer: BC Managed Care – PPO | Admitting: Orthopaedic Surgery

## 2019-02-07 ENCOUNTER — Other Ambulatory Visit: Payer: Self-pay

## 2019-02-07 VITALS — Ht 68.0 in | Wt 150.0 lb

## 2019-02-07 DIAGNOSIS — M5416 Radiculopathy, lumbar region: Secondary | ICD-10-CM | POA: Diagnosis not present

## 2019-02-07 DIAGNOSIS — M160 Bilateral primary osteoarthritis of hip: Secondary | ICD-10-CM | POA: Diagnosis not present

## 2019-02-07 MED ORDER — TRAMADOL HCL 50 MG PO TABS: ORAL_TABLET | ORAL | 0 refills | Status: DC

## 2019-02-07 NOTE — Addendum Note (Signed)
Addended by: Wendi Maya on: 02/07/2019 02:29 PM   Modules accepted: Orders

## 2019-02-07 NOTE — Telephone Encounter (Signed)
Ortho appt today@1pm  w/[facility].

## 2019-02-07 NOTE — Telephone Encounter (Signed)
I placed an urgent referral to ortho-please let him know and I will also route to [facility]  I appreciate sending the records re: MRI

## 2019-02-07 NOTE — Progress Notes (Signed)
Office Visit Note   Patient: Patrick Mcpherson           Date of Birth: 02-Mar-1960           MRN: [id number] Visit Date: 02/07/2019              Requested by: Tower, Audrie Gallus, MD [address] [place],  [place] [address] PCP: Judy Pimple, MD   Assessment & Plan: Visit Diagnoses:  1. Lumbar back pain with radiculopathy affecting left lower extremity   2. Bilateral primary osteoarthritis of hip     Plan: Approximate onset of low back and left hip pain about 3-1/2 weeks ago.  Initially treated by primary care physician with Medrol Dosepak and Robaxin.  Still having considerable pain in the area of his lateral left hip groin and buttock.  No bowel or bladder changes.  X-rays demonstrate significant degenerative disc disease at L5-S1 which I am sure is partially responsible for his pain.  He did have x-rays of both of his hips in 2019 demonstrating significant degenerative changes.  I believe that is also contributing to his pain.  I would like to obtain an MRI scan of his lumbar spine and schedule an intra-articular cortisone injection of his left hip.  Tramadol at night for pain.  May continue with the Advil and Robaxin  Follow-Up Instructions: Return After MRI scan lumbar spine.   Orders:  No orders of the defined types were placed in this encounter.  No orders of the defined types were placed in this encounter.     Procedures: No procedures performed   Clinical Data: No additional findings.   Subjective: Chief Complaint  Patient presents with  . Lower Back - Pain  Patient presents today for lower back and left hip pain. He said that the pain started in July of 2020.He was doing line dancing and states that his buttock area felt very irritated, but improved. He power walked in December and thought he may have just pulled something, but it has continued. He has pain that radiates down his left leg and into his groin as well. He feels better with standing, but sitting  makes the pain worse. He gets tingling in his leg after prolonged sitting or upon getting up. He started taking meloxicam first and got some relief. He then switched over to Ibuprofen, with some relief as well. He tried a [place] with temporary relief. He saw Dr.Tower and was given prednisone, which he has now finished. His pain was better at the middle of his prednisone pack, but then returned.  Has been experiencing left groin pain and even discomfort along the lateral aspect of his hip.  Has had buttock discomfort and some mild low back pain.  Occasionally has tingling and numbness into his left leg.  No problems referable to the right lower extremity.  I did review his films from Dr. Lucretia Roers office demonstrating significant degenerative disc disease at L5-S1.  I performed films of his pelvis in 2019 demonstrating degenerative changes  HPI  Review of Systems   Objective: Vital Signs: Ht 5\' 8"  (1.727 m)   Wt 150 lb (68 kg)   BMI 22.81 kg/m   Physical Exam Constitutional:      Appearance: He is well-developed.  Eyes:     Pupils: Pupils are equal, round, and reactive to light.  Pulmonary:     Effort: Pulmonary effort is normal.  Skin:    General: Skin is warm and dry.  Neurological:     Mental Status: He is alert and oriented to person, place, and time.  Psychiatric:        Behavior: Behavior normal.     Ortho Exam awake alert and oriented x3.  Comfortable sitting.  There is limited range of motion of both of his hips and pain on extremes of internal and external rotation on the left.  The pain was referred along the anterior thigh as far distally as the knee.  No localized areas of tenderness about the buttock or lateral aspect of the left hip.  No percussible tenderness of the lumbar spine.  Straight leg raise is negative.  Reflexes were symmetrical.  Motor and sensory exam intact Specialty Comments:  No specialty comments available.  Imaging: No results found.   PMFS  History: Patient Active Problem List   Diagnosis Date Noted  . Bilateral primary osteoarthritis of hip 02/07/2019  . Meralgia paraesthetica, right 06/23/2018  . Elevated glucose level 06/20/2017  . Lumbar back pain with radiculopathy affecting left lower extremity 06/17/2014  . Unspecified sleep apnea 03/28/2013  . Colon cancer screening 09/29/2011  . Snoring 12/02/2010  . Routine general medical examination at a health care facility 11/23/2010  . Prostate cancer screening 11/23/2010  . ANXIETY, SITUATIONAL 01/21/2010  . TINNITUS 01/21/2010   Past Medical History:  Diagnosis Date  . Anxiety    Stress reaction  . Hyperlipidemia    borderline    Family History  Problem Relation Age of Onset  . Cancer Other        Prostate CA in older years  . Heart disease Mother        CAD, bypass  . Diabetes Mother   . Cancer Father        Lymphoma    Past Surgical History:  Procedure Laterality Date  . RHINOPLASTY    . VASECTOMY     Social History   Occupational History  . Occupation: Futures trader: [employer]  Tobacco Use  . Smoking status: Never Smoker  . Smokeless tobacco: Never Used  Substance and Sexual Activity  . Alcohol use: Yes    Alcohol/week: 0.0 standard drinks    Comment: RARELY  . Drug use: No  . Sexual activity: Not on file

## 2019-02-07 NOTE — Telephone Encounter (Signed)
Pt's insurance is denying auth for MRI L Spine.  Please call [facility] ([facility])@1 [phone] Tracking# [id number] to speak w/MD Medical Reviewer.  I spoke w/pt this morning.  He is in extreme pain and ?s if he should get a cortisone injection prior to MRI.  Pt saw chiropractor in July and Dec 2020 (7 total visits).

## 2019-02-07 NOTE — Addendum Note (Signed)
Addended by: Roxy Manns A on: 02/07/2019 10:09 AM   Modules accepted: Orders

## 2019-02-10 ENCOUNTER — Other Ambulatory Visit: Payer: BC Managed Care – PPO

## 2019-02-12 ENCOUNTER — Other Ambulatory Visit: Payer: BC Managed Care – PPO

## 2019-02-13 ENCOUNTER — Ambulatory Visit
Admission: RE | Admit: 2019-02-13 | Discharge: 2019-02-13 | Disposition: A | Discharge status: Home / Self Care | Payer: BC Managed Care – PPO | Source: Ambulatory Visit | Attending: Orthopaedic Surgery | Admitting: Orthopaedic Surgery

## 2019-02-13 ENCOUNTER — Ambulatory Visit: Payer: BC Managed Care – PPO | Admitting: Orthopaedic Surgery

## 2019-02-13 DIAGNOSIS — M48061 Spinal stenosis, lumbar region without neurogenic claudication: Secondary | ICD-10-CM | POA: Diagnosis not present

## 2019-02-13 DIAGNOSIS — M5416 Radiculopathy, lumbar region: Secondary | ICD-10-CM

## 2019-02-14 ENCOUNTER — Other Ambulatory Visit: Payer: BC Managed Care – PPO

## 2019-02-16 ENCOUNTER — Other Ambulatory Visit: Payer: BC Managed Care – PPO

## 2019-02-19 ENCOUNTER — Ambulatory Visit: Payer: BC Managed Care – PPO | Admitting: Physical Medicine and Rehabilitation

## 2019-02-20 ENCOUNTER — Other Ambulatory Visit: Payer: Self-pay

## 2019-02-20 ENCOUNTER — Encounter: Payer: Self-pay | Admitting: Orthopaedic Surgery

## 2019-02-20 ENCOUNTER — Ambulatory Visit (INDEPENDENT_AMBULATORY_CARE_PROVIDER_SITE_OTHER): Payer: BC Managed Care – PPO | Admitting: Orthopaedic Surgery

## 2019-02-20 VITALS — Ht 68.0 in | Wt 150.0 lb

## 2019-02-20 DIAGNOSIS — M160 Bilateral primary osteoarthritis of hip: Secondary | ICD-10-CM | POA: Diagnosis not present

## 2019-02-20 DIAGNOSIS — M5416 Radiculopathy, lumbar region: Secondary | ICD-10-CM

## 2019-02-20 NOTE — Progress Notes (Signed)
Office Visit Note   Patient: Patrick Mcpherson           Date of Birth: Mar 01, 1960           MRN: [id number] Visit Date: 02/20/2019              Requested by: Tower, Audrie Gallus, MD [address] [place],  [place] [address] PCP: Judy Pimple, MD   Assessment & Plan: Visit Diagnoses:  1. Lumbar back pain with radiculopathy affecting left lower extremity   2. Bilateral primary osteoarthritis of hip     Plan: MRI scan revealed mild facet arthritis at virtually every level lumbar spine.  There were some areas of mild foraminal stenosis of both the right and the left at varying levels as well.  I think the majority of his pain originates from his left hip.  He is scheduled to have an intra-articular cortisone injection next week and will monitor his response.  Also try a course of physical therapy for his lumbar spine at [facility]-his choice.  Has tried a muscle relaxant and prednisone through his primary care physician that helped "a little bit".  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  I suspect the majority of his pain originates from his hip.  Return to the office in 1 month  Follow-Up Instructions: Return in about 1 month (around 03/23/2019).   Orders:  Orders Placed This Encounter  Procedures  . Ambulatory referral to Physical Therapy   No orders of the defined types were placed in this encounter.     Procedures: No procedures performed   Clinical Data: No additional findings.   Subjective: Chief Complaint  Patient presents with  . Lower Back - Follow-up    MRI results of L-Spine  Patient presents today for follow up on his lower back pain. He had an MRI of his L-Spine on 02/13/2019 and is here today to go over those results. Patient states that he has noticed "slight improvement". This is the second day without any over the counter pain medicine. He said that his  left side if agrivated from the drive here.   HPI  Review of Systems   Objective: Vital Signs: Ht 5\' 8"  (1.727 m)   Wt 150 lb (68 kg)   BMI 22.81 kg/m   Physical Exam Constitutional:      Appearance: He is well-developed.  Eyes:     Pupils: Pupils are equal, round, and reactive to light.  Pulmonary:     Effort: Pulmonary effort is normal.  Skin:    General: Skin is warm and dry.  Neurological:     Mental Status: He is alert and oriented to person, place, and time.  Psychiatric:        Behavior: Behavior normal.     Ortho Exam awake alert and oriented x3.  Comfortable sitting.  Still has some limited range of motion of his left hip with guarding.  Most of the pain is along the lateral aspect of his hip rather than the groin.  Straight leg raise was negative.  Motor exam intact.  No percussible tenderness of the lumbar spine  Specialty Comments:  No specialty comments available.  Imaging: No results found.   PMFS History: Patient Active Problem List   Diagnosis Date Noted  .  Bilateral primary osteoarthritis of hip 02/07/2019  . Meralgia paraesthetica, right 06/23/2018  . Elevated glucose level 06/20/2017  . Lumbar back pain with radiculopathy affecting left lower extremity 06/17/2014  . Unspecified sleep apnea 03/28/2013  . Colon cancer screening 09/29/2011  . Snoring 12/02/2010  . Routine general medical examination at a health care facility 11/23/2010  . Prostate cancer screening 11/23/2010  . ANXIETY, SITUATIONAL 01/21/2010  . TINNITUS 01/21/2010   Past Medical History:  Diagnosis Date  . Anxiety    Stress reaction  . Hyperlipidemia    borderline    Family History  Problem Relation Age of Onset  . Cancer Other        Prostate CA in older years  . Heart disease Mother        CAD, bypass  . Diabetes Mother   . Cancer Father        Lymphoma    Past Surgical History:  Procedure Laterality Date  . RHINOPLASTY    . VASECTOMY     Social History    Occupational History  . Occupation: Futures trader: [employer]  Tobacco Use  . Smoking status: Never Smoker  . Smokeless tobacco: Never Used  Substance and Sexual Activity  . Alcohol use: Yes    Alcohol/week: 0.0 standard drinks    Comment: RARELY  . Drug use: No  . Sexual activity: Not on file

## 2019-02-22 ENCOUNTER — Telehealth: Payer: Self-pay | Admitting: Orthopaedic Surgery

## 2019-02-22 NOTE — Telephone Encounter (Signed)
Patient stated that Patrick Mcpherson ordered PT for him @ [facility] and they have not received an order or referral yet. Pt has an appt and made w/o by they insist an order be sent.  Please call patient to advise.  [phone]

## 2019-02-22 NOTE — Telephone Encounter (Signed)
Faxed order. Notified patient.

## 2019-02-23 DIAGNOSIS — M79652 Pain in left thigh: Secondary | ICD-10-CM | POA: Diagnosis not present

## 2019-02-23 DIAGNOSIS — M6281 Muscle weakness (generalized): Secondary | ICD-10-CM | POA: Diagnosis not present

## 2019-02-23 DIAGNOSIS — M5416 Radiculopathy, lumbar region: Secondary | ICD-10-CM | POA: Diagnosis not present

## 2019-02-23 DIAGNOSIS — M160 Bilateral primary osteoarthritis of hip: Secondary | ICD-10-CM | POA: Diagnosis not present

## 2019-02-26 DIAGNOSIS — M160 Bilateral primary osteoarthritis of hip: Secondary | ICD-10-CM | POA: Diagnosis not present

## 2019-02-26 DIAGNOSIS — M545 Low back pain: Secondary | ICD-10-CM | POA: Diagnosis not present

## 2019-02-26 DIAGNOSIS — M79652 Pain in left thigh: Secondary | ICD-10-CM | POA: Diagnosis not present

## 2019-02-26 DIAGNOSIS — M6281 Muscle weakness (generalized): Secondary | ICD-10-CM | POA: Diagnosis not present

## 2019-02-28 ENCOUNTER — Ambulatory Visit: Payer: BC Managed Care – PPO | Admitting: Physical Medicine and Rehabilitation

## 2019-02-28 DIAGNOSIS — M79652 Pain in left thigh: Secondary | ICD-10-CM | POA: Diagnosis not present

## 2019-02-28 DIAGNOSIS — M5416 Radiculopathy, lumbar region: Secondary | ICD-10-CM | POA: Diagnosis not present

## 2019-02-28 DIAGNOSIS — M6281 Muscle weakness (generalized): Secondary | ICD-10-CM | POA: Diagnosis not present

## 2019-02-28 DIAGNOSIS — M545 Low back pain: Secondary | ICD-10-CM | POA: Diagnosis not present

## 2019-03-02 DIAGNOSIS — M5416 Radiculopathy, lumbar region: Secondary | ICD-10-CM | POA: Diagnosis not present

## 2019-03-02 DIAGNOSIS — M79652 Pain in left thigh: Secondary | ICD-10-CM | POA: Diagnosis not present

## 2019-03-02 DIAGNOSIS — M6281 Muscle weakness (generalized): Secondary | ICD-10-CM | POA: Diagnosis not present

## 2019-03-02 DIAGNOSIS — M160 Bilateral primary osteoarthritis of hip: Secondary | ICD-10-CM | POA: Diagnosis not present

## 2019-03-05 DIAGNOSIS — M6281 Muscle weakness (generalized): Secondary | ICD-10-CM | POA: Diagnosis not present

## 2019-03-05 DIAGNOSIS — M79652 Pain in left thigh: Secondary | ICD-10-CM | POA: Diagnosis not present

## 2019-03-05 DIAGNOSIS — M545 Low back pain: Secondary | ICD-10-CM | POA: Diagnosis not present

## 2019-03-05 DIAGNOSIS — M160 Bilateral primary osteoarthritis of hip: Secondary | ICD-10-CM | POA: Diagnosis not present

## 2019-03-07 DIAGNOSIS — M160 Bilateral primary osteoarthritis of hip: Secondary | ICD-10-CM | POA: Diagnosis not present

## 2019-03-07 DIAGNOSIS — M5416 Radiculopathy, lumbar region: Secondary | ICD-10-CM | POA: Diagnosis not present

## 2019-03-07 DIAGNOSIS — M6281 Muscle weakness (generalized): Secondary | ICD-10-CM | POA: Diagnosis not present

## 2019-03-07 DIAGNOSIS — M79652 Pain in left thigh: Secondary | ICD-10-CM | POA: Diagnosis not present

## 2019-03-09 DIAGNOSIS — M6281 Muscle weakness (generalized): Secondary | ICD-10-CM | POA: Diagnosis not present

## 2019-03-09 DIAGNOSIS — M160 Bilateral primary osteoarthritis of hip: Secondary | ICD-10-CM | POA: Diagnosis not present

## 2019-03-09 DIAGNOSIS — M79652 Pain in left thigh: Secondary | ICD-10-CM | POA: Diagnosis not present

## 2019-03-09 DIAGNOSIS — M5416 Radiculopathy, lumbar region: Secondary | ICD-10-CM | POA: Diagnosis not present

## 2019-03-13 DIAGNOSIS — M545 Low back pain: Secondary | ICD-10-CM | POA: Diagnosis not present

## 2019-03-13 DIAGNOSIS — M6281 Muscle weakness (generalized): Secondary | ICD-10-CM | POA: Diagnosis not present

## 2019-03-13 DIAGNOSIS — M79652 Pain in left thigh: Secondary | ICD-10-CM | POA: Diagnosis not present

## 2019-03-13 DIAGNOSIS — M25552 Pain in left hip: Secondary | ICD-10-CM | POA: Diagnosis not present

## 2019-03-16 DIAGNOSIS — M25611 Stiffness of right shoulder, not elsewhere classified: Secondary | ICD-10-CM | POA: Diagnosis not present

## 2019-03-16 DIAGNOSIS — M7541 Impingement syndrome of right shoulder: Secondary | ICD-10-CM | POA: Diagnosis not present

## 2019-03-16 DIAGNOSIS — M6281 Muscle weakness (generalized): Secondary | ICD-10-CM | POA: Diagnosis not present

## 2019-03-16 DIAGNOSIS — M24111 Other articular cartilage disorders, right shoulder: Secondary | ICD-10-CM | POA: Diagnosis not present

## 2019-03-19 DIAGNOSIS — M5416 Radiculopathy, lumbar region: Secondary | ICD-10-CM | POA: Diagnosis not present

## 2019-03-19 DIAGNOSIS — M79652 Pain in left thigh: Secondary | ICD-10-CM | POA: Diagnosis not present

## 2019-03-19 DIAGNOSIS — M545 Low back pain: Secondary | ICD-10-CM | POA: Diagnosis not present

## 2019-03-19 DIAGNOSIS — M160 Bilateral primary osteoarthritis of hip: Secondary | ICD-10-CM | POA: Diagnosis not present

## 2019-03-23 DIAGNOSIS — M545 Low back pain: Secondary | ICD-10-CM | POA: Diagnosis not present

## 2019-03-23 DIAGNOSIS — M6281 Muscle weakness (generalized): Secondary | ICD-10-CM | POA: Diagnosis not present

## 2019-03-23 DIAGNOSIS — M79652 Pain in left thigh: Secondary | ICD-10-CM | POA: Diagnosis not present

## 2019-03-23 DIAGNOSIS — M160 Bilateral primary osteoarthritis of hip: Secondary | ICD-10-CM | POA: Diagnosis not present

## 2019-03-27 DIAGNOSIS — M6281 Muscle weakness (generalized): Secondary | ICD-10-CM | POA: Diagnosis not present

## 2019-03-27 DIAGNOSIS — M545 Low back pain: Secondary | ICD-10-CM | POA: Diagnosis not present

## 2019-03-27 DIAGNOSIS — M160 Bilateral primary osteoarthritis of hip: Secondary | ICD-10-CM | POA: Diagnosis not present

## 2019-03-27 DIAGNOSIS — M5416 Radiculopathy, lumbar region: Secondary | ICD-10-CM | POA: Diagnosis not present

## 2019-04-02 DIAGNOSIS — M160 Bilateral primary osteoarthritis of hip: Secondary | ICD-10-CM | POA: Diagnosis not present

## 2019-04-02 DIAGNOSIS — M545 Low back pain: Secondary | ICD-10-CM | POA: Diagnosis not present

## 2019-04-02 DIAGNOSIS — M6281 Muscle weakness (generalized): Secondary | ICD-10-CM | POA: Diagnosis not present

## 2019-04-02 DIAGNOSIS — M5416 Radiculopathy, lumbar region: Secondary | ICD-10-CM | POA: Diagnosis not present

## 2019-04-16 DIAGNOSIS — M160 Bilateral primary osteoarthritis of hip: Secondary | ICD-10-CM | POA: Diagnosis not present

## 2019-04-16 DIAGNOSIS — M5416 Radiculopathy, lumbar region: Secondary | ICD-10-CM | POA: Diagnosis not present

## 2019-04-16 DIAGNOSIS — M545 Low back pain: Secondary | ICD-10-CM | POA: Diagnosis not present

## 2019-04-16 DIAGNOSIS — M79652 Pain in left thigh: Secondary | ICD-10-CM | POA: Diagnosis not present

## 2019-06-12 ENCOUNTER — Telehealth: Payer: Self-pay | Admitting: Family Medicine

## 2019-06-12 DIAGNOSIS — Z125 Encounter for screening for malignant neoplasm of prostate: Secondary | ICD-10-CM

## 2019-06-12 DIAGNOSIS — Z0000 Encounter for general adult medical examination without abnormal findings: Secondary | ICD-10-CM

## 2019-06-12 DIAGNOSIS — R7309 Other abnormal glucose: Secondary | ICD-10-CM

## 2019-06-12 NOTE — Telephone Encounter (Signed)
-----   Message from Alvina Chou sent at 06/07/2019  9:26 AM EDT ----- Regarding: Lab orders for Tuesday 5.18.21 Patient is scheduled for CPX labs, please order future labs, Thanks , Camelia Eng

## 2019-06-18 ENCOUNTER — Other Ambulatory Visit: Payer: Self-pay

## 2019-06-18 ENCOUNTER — Other Ambulatory Visit (INDEPENDENT_AMBULATORY_CARE_PROVIDER_SITE_OTHER): Payer: BC Managed Care – PPO

## 2019-06-18 ENCOUNTER — Other Ambulatory Visit: Payer: BC Managed Care – PPO

## 2019-06-18 DIAGNOSIS — R7309 Other abnormal glucose: Secondary | ICD-10-CM | POA: Diagnosis not present

## 2019-06-18 DIAGNOSIS — Z0000 Encounter for general adult medical examination without abnormal findings: Secondary | ICD-10-CM

## 2019-06-18 DIAGNOSIS — Z125 Encounter for screening for malignant neoplasm of prostate: Secondary | ICD-10-CM | POA: Diagnosis not present

## 2019-06-18 LAB — CBC WITH DIFFERENTIAL/PLATELET
Basophils Absolute: 0 10*3/uL (ref 0.0–0.1)
Basophils Relative: 1 % (ref 0.0–3.0)
Eosinophils Absolute: 0.2 10*3/uL (ref 0.0–0.7)
Eosinophils Relative: 3.9 % (ref 0.0–5.0)
HCT: 43.6 % (ref 39.0–52.0)
Hemoglobin: 14.7 g/dL (ref 13.0–17.0)
Lymphocytes Relative: 28.5 % (ref 12.0–46.0)
Lymphs Abs: 1.1 10*3/uL (ref 0.7–4.0)
MCHC: 33.8 g/dL (ref 30.0–36.0)
MCV: 94.9 fl (ref 78.0–100.0)
Monocytes Absolute: 0.5 10*3/uL (ref 0.1–1.0)
Monocytes Relative: 12.3 % — ABNORMAL HIGH (ref 3.0–12.0)
Neutro Abs: 2.1 10*3/uL (ref 1.4–7.7)
Neutrophils Relative %: 54.3 % (ref 43.0–77.0)
Platelets: 240 10*3/uL (ref 150.0–400.0)
RBC: 4.59 Mil/uL (ref 4.22–5.81)
RDW: 13.1 % (ref 11.5–15.5)
WBC: 4 10*3/uL (ref 4.0–10.5)

## 2019-06-18 LAB — LIPID PANEL
Cholesterol: 182 mg/dL (ref 0–200)
HDL: 70.2 mg/dL (ref >39.00)
LDL Cholesterol: 95 mg/dL (ref 0–99)
NonHDL: 111.35
Total CHOL/HDL Ratio: 3
Triglycerides: 80 mg/dL (ref 0.0–149.0)
VLDL: 16 mg/dL (ref 0.0–40.0)

## 2019-06-18 LAB — COMPREHENSIVE METABOLIC PANEL
ALT: 14 U/L (ref 0–53)
AST: 17 U/L (ref 0–37)
Albumin: 4.3 g/dL (ref 3.5–5.2)
Alkaline Phosphatase: 119 U/L — ABNORMAL HIGH (ref 39–117)
BUN: 21 mg/dL (ref 6–23)
CO2: 31 mEq/L (ref 19–32)
Calcium: 9 mg/dL (ref 8.4–10.5)
Chloride: 104 mEq/L (ref 96–112)
Creatinine, Ser: 0.97 mg/dL (ref 0.40–1.50)
GFR: 78.84 mL/min (ref >60.00)
Glucose, Bld: 111 mg/dL — ABNORMAL HIGH (ref 70–99)
Potassium: 4.4 mEq/L (ref 3.5–5.1)
Sodium: 140 mEq/L (ref 135–145)
Total Bilirubin: 0.8 mg/dL (ref 0.2–1.2)
Total Protein: 6.2 g/dL (ref 6.0–8.3)

## 2019-06-18 LAB — HEMOGLOBIN A1C: Hgb A1c MFr Bld: 5.7 % (ref 4.6–6.5)

## 2019-06-19 ENCOUNTER — Other Ambulatory Visit: Payer: BLUE CROSS/BLUE SHIELD

## 2019-06-19 LAB — TSH: TSH: 2.12 u[IU]/mL (ref 0.35–4.50)

## 2019-06-19 LAB — PSA: PSA: 0.24 ng/mL (ref 0.10–4.00)

## 2019-06-26 ENCOUNTER — Ambulatory Visit (INDEPENDENT_AMBULATORY_CARE_PROVIDER_SITE_OTHER): Payer: BC Managed Care – PPO | Admitting: Family Medicine

## 2019-06-26 ENCOUNTER — Encounter: Payer: Self-pay | Admitting: Family Medicine

## 2019-06-26 ENCOUNTER — Other Ambulatory Visit: Payer: Self-pay

## 2019-06-26 VITALS — BP 118/68 | HR 57 | Temp 96.9°F | Ht 68.0 in | Wt 156.4 lb

## 2019-06-26 DIAGNOSIS — Z0000 Encounter for general adult medical examination without abnormal findings: Secondary | ICD-10-CM

## 2019-06-26 DIAGNOSIS — Z1211 Encounter for screening for malignant neoplasm of colon: Secondary | ICD-10-CM

## 2019-06-26 DIAGNOSIS — R7309 Other abnormal glucose: Secondary | ICD-10-CM

## 2019-06-26 DIAGNOSIS — Z125 Encounter for screening for malignant neoplasm of prostate: Secondary | ICD-10-CM | POA: Diagnosis not present

## 2019-06-26 NOTE — Patient Instructions (Addendum)
Let's sign you up for cologuard program   If you are interested in the shingles vaccine series (Shingrix), call your insurance or pharmacy to check on coverage and location it must be given.  If affordable - you can schedule it here or at your pharmacy depending on coverage   Keep taking care of yourself  Eat healthy and stay active  Wear sun protection  Labs are stable   Consider covid vaccination in the future

## 2019-06-26 NOTE — Assessment & Plan Note (Signed)
Ref for cologuard  Pt declines colonoscopy for now

## 2019-06-26 NOTE — Assessment & Plan Note (Signed)
Reviewed health habits including diet and exercise and skin cancer prevention Reviewed appropriate screening tests for age  Also reviewed health mt list, fam hx and immunization status , as well as social and family history   See HPI Labs reviewed  Signed up for cologuard program for colon cancer screening  Encouraged to consider covid imm  Also to consider shingrix vaccine  Pt did not get flu shot this season  Stable psa with no voiding symptoms  Commended good exercise habits

## 2019-06-26 NOTE — Progress Notes (Signed)
Subjective:    Patient ID: Patrick Mcpherson, male    DOB: 07/26/1960, 59 y.o.   MRN: [id number]  This visit occurred during the SARS-CoV-2 public health emergency.  Safety protocols were in place, including screening questions prior to the visit, additional usage of staff PPE, and extensive cleaning of exam room while observing appropriate contact time as indicated for disinfecting solutions.    HPI Here for health maintenance exam and to review chronic medical problems    Wt Readings from Last 3 Encounters:  06/26/19 156 lb 7 oz (71 kg)  02/20/19 150 lb (68 kg)  02/07/19 150 lb (68 kg)   23.79 kg/m   Has felt well  Fair self care  Intense exercise is way down after back/hip problem  He is back to walking for exercise   occ running -not as much/ a few running events   Plans to re join the [employer]  Wants to swim and wt lift    covid status - has not had covid that he had it  Is considering vaccination later on -not ready yet  Does not have any questions about it   cologuard neg 6/18  Still declines colonoscopy   Flu shot -declines  Tdap 9/14 Zoster status -may be interested in shingrix in the future   Prostate health  Lab Results  Component Value Date   PSA 0.24 06/18/2019   PSA 0.25 06/20/2018   PSA 0.31 06/15/2017   stable and low  No prostate ca in family  No prostate symptoms or nocturia   BP Readings from Last 3 Encounters:  06/26/19 118/68  01/24/19 106/78  01/30/18 112/76   Pulse Readings from Last 3 Encounters:  06/26/19 (!) 57  01/24/19 72  01/30/18 73   H/o elevated glucose level in the past  Lab Results  Component Value Date   HGBA1C 5.7 06/18/2019   Fasting glucose is 111  Cholesterol  Lab Results  Component Value Date   CHOL 182 06/18/2019   CHOL 179 06/20/2018   CHOL 180 06/15/2017   Lab Results  Component Value Date   HDL 70.20 06/18/2019   HDL 68.30 06/20/2018   HDL 78.80 06/15/2017   Lab Results  Component Value Date   LDLCALC 95 06/18/2019   LDLCALC 100 (H) 06/20/2018   [place] 90 06/15/2017   Lab Results  Component Value Date   TRIG 80.0 06/18/2019   TRIG 55.0 06/20/2018   TRIG 56.0 06/15/2017   Lab Results  Component Value Date   CHOLHDL 3 06/18/2019   CHOLHDL 3 06/20/2018   CHOLHDL 2 06/15/2017   Lab Results  Component Value Date   LDLDIRECT 120.9 11/24/2010   LDLDIRECT 132.0 01/14/2010   Stable and well controlled  In general eats fairly healthy  occ cheats - has to convenience eat (beef/fried chicken)   Lab Results  Component Value Date   WBC 4.0 06/18/2019   HGB 14.7 06/18/2019   HCT 43.6 06/18/2019   MCV 94.9 06/18/2019   PLT 240.0 06/18/2019     Chemistry      Component Value Date/Time   NA 140 06/18/2019 0827   K 4.4 06/18/2019 0827   CL 104 06/18/2019 0827   CO2 31 06/18/2019 0827   BUN 21 06/18/2019 0827   CREATININE 0.97 06/18/2019 0827      Component Value Date/Time   CALCIUM 9.0 06/18/2019 0827   ALKPHOS 119 (H) 06/18/2019 0827   AST 17 06/18/2019 0827   ALT 14 06/18/2019  0827   BILITOT 0.8 06/18/2019 0827     watched the alk phos   Lab Results  Component Value Date   TSH 2.12 06/18/2019    fam hx- father had lymphoma in later years  No new fam hx  Patient Active Problem List   Diagnosis Date Noted  . Bilateral primary osteoarthritis of hip 02/07/2019  . Meralgia paraesthetica, right 06/23/2018  . Elevated glucose level 06/20/2017  . Lumbar back pain with radiculopathy affecting left lower extremity 06/17/2014  . Unspecified sleep apnea 03/28/2013  . Colon cancer screening 09/29/2011  . Snoring 12/02/2010  . Routine general medical examination at a health care facility 11/23/2010  . Prostate cancer screening 11/23/2010  . ANXIETY, SITUATIONAL 01/21/2010  . TINNITUS 01/21/2010   Past Medical History:  Diagnosis Date  . Anxiety    Stress reaction  . Hyperlipidemia    borderline   Past Surgical History:  Procedure Laterality Date  .  RHINOPLASTY    . VASECTOMY     Social History   Tobacco Use  . Smoking status: Never Smoker  . Smokeless tobacco: Never Used  Substance Use Topics  . Alcohol use: Yes    Alcohol/week: 0.0 standard drinks    Comment: RARELY  . Drug use: No   Family History  Problem Relation Age of Onset  . Cancer Other        Prostate CA in older years  . Heart disease Mother        CAD, bypass  . Diabetes Mother   . Cancer Father        Lymphoma   Allergies  Allergen Reactions  . Shellfish Allergy     REACTION: dizzy and nauseated   Current Outpatient Medications on File Prior to Visit  Medication Sig Dispense Refill  . Ascorbic Acid (VITAMIN C) 100 MG tablet Take 100 mg by mouth once a week.     Marland Kitchen b complex vitamins tablet Take 1 tablet by mouth daily.    . Flaxseed, Linseed, (FLAX SEED OIL PO) Take by mouth 4 (four) times a week.    . Misc Natural Products (OSTEO BI-FLEX ADV DOUBLE ST PO) Take 1 tablet by mouth 2 (two) times daily.     No current facility-administered medications on file prior to visit.    Review of Systems  Constitutional: Negative for activity change, appetite change, fatigue, fever and unexpected weight change.  HENT: Negative for congestion, rhinorrhea, sore throat and trouble swallowing.   Eyes: Negative for pain, redness, itching and visual disturbance.  Respiratory: Negative for cough, chest tightness, shortness of breath and wheezing.   Cardiovascular: Negative for chest pain and palpitations.  Gastrointestinal: Negative for abdominal pain, blood in stool, constipation, diarrhea and nausea.  Endocrine: Negative for cold intolerance, heat intolerance, polydipsia and polyuria.  Genitourinary: Negative for difficulty urinating, dysuria, frequency and urgency.  Musculoskeletal: Positive for arthralgias and back pain. Negative for joint swelling and myalgias.  Skin: Negative for pallor and rash.  Neurological: Negative for dizziness, tremors, weakness, numbness  and headaches.  Hematological: Negative for adenopathy. Does not bruise/bleed easily.  Psychiatric/Behavioral: Negative for decreased concentration and dysphoric mood. The patient is not nervous/anxious.        Objective:   Physical Exam Constitutional:      General: He is not in acute distress.    Appearance: Normal appearance. He is well-developed and normal weight. He is not ill-appearing or diaphoretic.  HENT:     Head: Normocephalic and atraumatic.  Right Ear: Tympanic membrane, ear canal and external ear normal.     Left Ear: Tympanic membrane, ear canal and external ear normal.     Nose: Nose normal. No congestion.     Mouth/Throat:     Mouth: Mucous membranes are moist.     Pharynx: Oropharynx is clear. No posterior oropharyngeal erythema.  Eyes:     General: No scleral icterus.       Right eye: No discharge.        Left eye: No discharge.     Conjunctiva/sclera: Conjunctivae normal.     Pupils: Pupils are equal, round, and reactive to light.  Neck:     Thyroid: No thyromegaly.     Vascular: No carotid bruit or JVD.  Cardiovascular:     Rate and Rhythm: Normal rate and regular rhythm.     Pulses: Normal pulses.     Heart sounds: Normal heart sounds. No gallop.   Pulmonary:     Effort: Pulmonary effort is normal. No respiratory distress.     Breath sounds: Normal breath sounds. No wheezing or rales.     Comments: Good air exch Chest:     Chest wall: No tenderness.  Abdominal:     General: Bowel sounds are normal. There is no distension or abdominal bruit.     Palpations: Abdomen is soft. There is no mass.     Tenderness: There is no abdominal tenderness.     Hernia: No hernia is present.  Musculoskeletal:        General: No tenderness.     Cervical back: Normal range of motion and neck supple. No rigidity. No muscular tenderness.     Right lower leg: No edema.     Left lower leg: No edema.     Comments: No kyphosis  No acute joint changes   Lymphadenopathy:      Cervical: No cervical adenopathy.  Skin:    General: Skin is warm and dry.     Coloration: Skin is not pale.     Findings: No erythema or rash.     Comments: Solar lentigines diffusely   Neurological:     Mental Status: He is alert.     Cranial Nerves: No cranial nerve deficit.     Motor: No abnormal muscle tone.     Coordination: Coordination normal.     Gait: Gait normal.     Deep Tendon Reflexes: Reflexes are normal and symmetric. Reflexes normal.  Psychiatric:        Mood and Affect: Mood normal.        Cognition and Memory: Cognition and memory normal.     Comments: Pleasant            Assessment & Plan:   Problem List Items Addressed This Visit      Other   Routine general medical examination at a health care facility - Primary    Reviewed health habits including diet and exercise and skin cancer prevention Reviewed appropriate screening tests for age  Also reviewed health mt list, fam hx and immunization status , as well as social and family history   See HPI Labs reviewed  Signed up for cologuard program for colon cancer screening  Encouraged to consider covid imm  Also to consider shingrix vaccine  Pt did not get flu shot this season  Stable psa with no voiding symptoms  Commended good exercise habits      Prostate cancer screening    Lab Results  Component Value Date   PSA 0.24 06/18/2019   PSA 0.25 06/20/2018   PSA 0.31 06/15/2017    No fam hx  No voiding changes       Colon cancer screening    Ref for cologuard  Pt declines colonoscopy for now       Elevated glucose level    Lab Results  Component Value Date   HGBA1C 5.7 06/18/2019   disc imp of low glycemic diet and wt loss to prevent DM2

## 2019-06-26 NOTE — Assessment & Plan Note (Signed)
Lab Results  Component Value Date   HGBA1C 5.7 06/18/2019   disc imp of low glycemic diet and wt loss to prevent DM2

## 2019-06-26 NOTE — Assessment & Plan Note (Signed)
Lab Results  Component Value Date   PSA 0.24 06/18/2019   PSA 0.25 06/20/2018   PSA 0.31 06/15/2017    No fam hx  No voiding changes

## 2019-07-26 DIAGNOSIS — H40003 Preglaucoma, unspecified, bilateral: Secondary | ICD-10-CM | POA: Diagnosis not present

## 2019-08-21 DIAGNOSIS — Z1211 Encounter for screening for malignant neoplasm of colon: Secondary | ICD-10-CM | POA: Diagnosis not present

## 2019-08-21 DIAGNOSIS — Z1212 Encounter for screening for malignant neoplasm of rectum: Secondary | ICD-10-CM | POA: Diagnosis not present

## 2019-08-22 LAB — COLOGUARD: Cologuard: NEGATIVE

## 2019-08-29 ENCOUNTER — Encounter: Payer: Self-pay | Admitting: Family Medicine

## 2019-08-29 DIAGNOSIS — Z20822 Contact with and (suspected) exposure to covid-19: Secondary | ICD-10-CM | POA: Diagnosis not present

## 2019-09-24 DIAGNOSIS — Z20822 Contact with and (suspected) exposure to covid-19: Secondary | ICD-10-CM | POA: Diagnosis not present

## 2019-10-15 DIAGNOSIS — Z20822 Contact with and (suspected) exposure to covid-19: Secondary | ICD-10-CM | POA: Diagnosis not present

## 2019-10-25 DIAGNOSIS — Z20822 Contact with and (suspected) exposure to covid-19: Secondary | ICD-10-CM | POA: Diagnosis not present

## 2019-11-02 DIAGNOSIS — Z20822 Contact with and (suspected) exposure to covid-19: Secondary | ICD-10-CM | POA: Diagnosis not present

## 2019-11-06 DIAGNOSIS — M79671 Pain in right foot: Secondary | ICD-10-CM | POA: Diagnosis not present

## 2019-11-06 DIAGNOSIS — M722 Plantar fascial fibromatosis: Secondary | ICD-10-CM | POA: Diagnosis not present

## 2019-11-26 DIAGNOSIS — Z20822 Contact with and (suspected) exposure to covid-19: Secondary | ICD-10-CM | POA: Diagnosis not present

## 2019-12-09 DIAGNOSIS — Z20822 Contact with and (suspected) exposure to covid-19: Secondary | ICD-10-CM | POA: Diagnosis not present

## 2019-12-24 DIAGNOSIS — Z20822 Contact with and (suspected) exposure to covid-19: Secondary | ICD-10-CM | POA: Diagnosis not present

## 2019-12-25 DIAGNOSIS — M722 Plantar fascial fibromatosis: Secondary | ICD-10-CM | POA: Diagnosis not present

## 2020-01-15 DIAGNOSIS — Z20822 Contact with and (suspected) exposure to covid-19: Secondary | ICD-10-CM | POA: Diagnosis not present

## 2020-01-23 DIAGNOSIS — Z20822 Contact with and (suspected) exposure to covid-19: Secondary | ICD-10-CM | POA: Diagnosis not present

## 2020-02-21 DIAGNOSIS — Z20822 Contact with and (suspected) exposure to covid-19: Secondary | ICD-10-CM | POA: Diagnosis not present

## 2020-02-25 DIAGNOSIS — Z20822 Contact with and (suspected) exposure to covid-19: Secondary | ICD-10-CM | POA: Diagnosis not present

## 2020-02-27 ENCOUNTER — Other Ambulatory Visit: Payer: Self-pay | Admitting: Sports Medicine

## 2020-02-27 DIAGNOSIS — M722 Plantar fascial fibromatosis: Secondary | ICD-10-CM | POA: Diagnosis not present

## 2020-02-27 DIAGNOSIS — M216X1 Other acquired deformities of right foot: Secondary | ICD-10-CM | POA: Diagnosis not present

## 2020-02-27 DIAGNOSIS — M79671 Pain in right foot: Secondary | ICD-10-CM | POA: Diagnosis not present

## 2020-03-16 DIAGNOSIS — Z20822 Contact with and (suspected) exposure to covid-19: Secondary | ICD-10-CM | POA: Diagnosis not present

## 2020-04-21 ENCOUNTER — Telehealth: Payer: Self-pay | Admitting: *Deleted

## 2020-04-21 NOTE — Telephone Encounter (Signed)
Patient called stating that he has not had the covid vaccines and is considering geting them. Patient stated that he has questions about the vaccines and side effects. Patient wants to know based on his medical history if Dr. Milinda Antis recommends one over the other.. Patient stated that he knows about 3 people that have developed thyroid problems shortly after taking the vaccine. Patient stated that he has done some research and had considered the [employer] but has decided against that one. Patient was advised that Dr. Milinda Antis is out of the office this week.

## 2020-04-21 NOTE — Telephone Encounter (Signed)
I do recommend Corporate treasurer or [employer]

## 2020-04-22 NOTE — Telephone Encounter (Signed)
Pt notified of Dr. Tower's comments  

## 2020-05-21 DIAGNOSIS — M216X1 Other acquired deformities of right foot: Secondary | ICD-10-CM | POA: Diagnosis not present

## 2020-05-21 DIAGNOSIS — M79671 Pain in right foot: Secondary | ICD-10-CM | POA: Diagnosis not present

## 2020-05-21 DIAGNOSIS — M9906 Segmental and somatic dysfunction of lower extremity: Secondary | ICD-10-CM | POA: Diagnosis not present

## 2020-05-21 DIAGNOSIS — M722 Plantar fascial fibromatosis: Secondary | ICD-10-CM | POA: Diagnosis not present

## 2020-06-13 ENCOUNTER — Other Ambulatory Visit: Payer: BC Managed Care – PPO

## 2020-06-16 ENCOUNTER — Telehealth: Payer: Self-pay | Admitting: Family Medicine

## 2020-06-16 DIAGNOSIS — R7309 Other abnormal glucose: Secondary | ICD-10-CM

## 2020-06-16 DIAGNOSIS — Z125 Encounter for screening for malignant neoplasm of prostate: Secondary | ICD-10-CM

## 2020-06-16 DIAGNOSIS — Z0000 Encounter for general adult medical examination without abnormal findings: Secondary | ICD-10-CM

## 2020-06-16 NOTE — Telephone Encounter (Signed)
-----   Message from Alvina Chou sent at 06/02/2020 11:45 AM EDT ----- Regarding: Lab orders for Tuesday, 5.17.22 Patient is scheduled for CPX labs, please order future labs, Thanks , Camelia Eng

## 2020-06-17 ENCOUNTER — Other Ambulatory Visit: Payer: Self-pay

## 2020-06-17 ENCOUNTER — Other Ambulatory Visit (INDEPENDENT_AMBULATORY_CARE_PROVIDER_SITE_OTHER): Payer: BC Managed Care – PPO

## 2020-06-17 DIAGNOSIS — Z0000 Encounter for general adult medical examination without abnormal findings: Secondary | ICD-10-CM

## 2020-06-17 DIAGNOSIS — Z125 Encounter for screening for malignant neoplasm of prostate: Secondary | ICD-10-CM | POA: Diagnosis not present

## 2020-06-17 DIAGNOSIS — R7309 Other abnormal glucose: Secondary | ICD-10-CM | POA: Diagnosis not present

## 2020-06-17 LAB — COMPREHENSIVE METABOLIC PANEL
ALT: 17 U/L (ref 0–53)
AST: 16 U/L (ref 0–37)
Albumin: 4.3 g/dL (ref 3.5–5.2)
Alkaline Phosphatase: 108 U/L (ref 39–117)
BUN: 18 mg/dL (ref 6–23)
CO2: 31 mEq/L (ref 19–32)
Calcium: 9.2 mg/dL (ref 8.4–10.5)
Chloride: 104 mEq/L (ref 96–112)
Creatinine, Ser: 1.03 mg/dL (ref 0.40–1.50)
GFR: 78.45 mL/min (ref >60.00)
Glucose, Bld: 106 mg/dL — ABNORMAL HIGH (ref 70–99)
Potassium: 4.2 mEq/L (ref 3.5–5.1)
Sodium: 140 mEq/L (ref 135–145)
Total Bilirubin: 0.6 mg/dL (ref 0.2–1.2)
Total Protein: 6.4 g/dL (ref 6.0–8.3)

## 2020-06-17 LAB — LIPID PANEL
Cholesterol: 193 mg/dL (ref 0–200)
HDL: 64.9 mg/dL (ref >39.00)
LDL Cholesterol: 114 mg/dL — ABNORMAL HIGH (ref 0–99)
NonHDL: 127.91
Total CHOL/HDL Ratio: 3
Triglycerides: 72 mg/dL (ref 0.0–149.0)
VLDL: 14.4 mg/dL (ref 0.0–40.0)

## 2020-06-17 LAB — TSH: TSH: 2.21 u[IU]/mL (ref 0.35–4.50)

## 2020-06-17 LAB — CBC WITH DIFFERENTIAL/PLATELET
Basophils Absolute: 0 10*3/uL (ref 0.0–0.1)
Basophils Relative: 0.6 % (ref 0.0–3.0)
Eosinophils Absolute: 0.1 10*3/uL (ref 0.0–0.7)
Eosinophils Relative: 3.7 % (ref 0.0–5.0)
HCT: 45.7 % (ref 39.0–52.0)
Hemoglobin: 15.1 g/dL (ref 13.0–17.0)
Lymphocytes Relative: 34.7 % (ref 12.0–46.0)
Lymphs Abs: 1.2 10*3/uL (ref 0.7–4.0)
MCHC: 33.1 g/dL (ref 30.0–36.0)
MCV: 93.4 fl (ref 78.0–100.0)
Monocytes Absolute: 0.4 10*3/uL (ref 0.1–1.0)
Monocytes Relative: 11.4 % (ref 3.0–12.0)
Neutro Abs: 1.8 10*3/uL (ref 1.4–7.7)
Neutrophils Relative %: 49.6 % (ref 43.0–77.0)
Platelets: 246 10*3/uL (ref 150.0–400.0)
RBC: 4.89 Mil/uL (ref 4.22–5.81)
RDW: 13.5 % (ref 11.5–15.5)
WBC: 3.6 10*3/uL — ABNORMAL LOW (ref 4.0–10.5)

## 2020-06-17 LAB — HEMOGLOBIN A1C: Hgb A1c MFr Bld: 5.9 % (ref 4.6–6.5)

## 2020-06-17 LAB — PSA: PSA: 0.29 ng/mL (ref 0.10–4.00)

## 2020-06-20 ENCOUNTER — Encounter: Payer: Self-pay | Admitting: Family Medicine

## 2020-06-20 ENCOUNTER — Ambulatory Visit (INDEPENDENT_AMBULATORY_CARE_PROVIDER_SITE_OTHER): Payer: BC Managed Care – PPO | Admitting: Family Medicine

## 2020-06-20 ENCOUNTER — Other Ambulatory Visit: Payer: Self-pay

## 2020-06-20 VITALS — BP 128/78 | HR 70 | Temp 96.9°F | Ht 68.0 in | Wt 158.5 lb

## 2020-06-20 DIAGNOSIS — Z1211 Encounter for screening for malignant neoplasm of colon: Secondary | ICD-10-CM

## 2020-06-20 DIAGNOSIS — X32XXXD Exposure to sunlight, subsequent encounter: Secondary | ICD-10-CM | POA: Diagnosis not present

## 2020-06-20 DIAGNOSIS — Z0000 Encounter for general adult medical examination without abnormal findings: Secondary | ICD-10-CM | POA: Diagnosis not present

## 2020-06-20 DIAGNOSIS — D225 Melanocytic nevi of trunk: Secondary | ICD-10-CM | POA: Diagnosis not present

## 2020-06-20 DIAGNOSIS — Z125 Encounter for screening for malignant neoplasm of prostate: Secondary | ICD-10-CM

## 2020-06-20 DIAGNOSIS — L570 Actinic keratosis: Secondary | ICD-10-CM | POA: Diagnosis not present

## 2020-06-20 DIAGNOSIS — Z1283 Encounter for screening for malignant neoplasm of skin: Secondary | ICD-10-CM | POA: Diagnosis not present

## 2020-06-20 DIAGNOSIS — R7309 Other abnormal glucose: Secondary | ICD-10-CM | POA: Diagnosis not present

## 2020-06-20 NOTE — Assessment & Plan Note (Signed)
Reviewed health habits including diet and exercise and skin cancer prevention Reviewed appropriate screening tests for age  Also reviewed health mt list, fam hx and immunization status , as well as social and family history   See HPI Labs reviewed  Given info on shingrix to consider  Declines covid and flu vaccines  cologuard neg 7/21  Dealing with foot problems-a bit less exercise  psa is stable

## 2020-06-20 NOTE — Patient Instructions (Addendum)
If you are interested in the shingles vaccine series (Shingrix), call your insurance or pharmacy to check on coverage and location it must be given.  If affordable - you can schedule it here or at your pharmacy depending on coverage   For cholesterol  Avoid red meat/ fried foods/ egg yolks/ fatty breakfast meats/ butter, cheese and high fat dairy/ and shellfish    Take care of yourself  Good luck with the new job

## 2020-06-20 NOTE — Progress Notes (Signed)
Subjective:    Patient ID: Patrick Mcpherson, male    DOB: [birth year], 60 y.o.   MRN: [id number]  This visit occurred during the SARS-CoV-2 public health emergency.  Safety protocols were in place, including screening questions prior to the visit, additional usage of staff PPE, and extensive cleaning of exam room while observing appropriate contact time as indicated for disinfecting solutions.    HPI Here for health maintenance exam and to review chronic medical problems    Wt Readings from Last 3 Encounters:  06/20/20 158 lb 8 oz (71.9 kg)  06/26/19 156 lb 7 oz (71 kg)  02/20/19 150 lb (68 kg)   24.10 kg/m   Feeling good overall  Less activity with age  He would like to get back to swim/run/bike   Dealing with heel problems and plantar fasciitis  Has not been to the pool yet  covid restrictions have been hard   covid status -has not had that he is aware of  Not immunized   Flu shot-no  cologuard neg 7/21 Does not want a colonocopy -not ready to do that  Tdap 9/14 Zoster status - not interested quite yet but would like information    Prostate health  Lab Results  Component Value Date   PSA 0.29 06/17/2020   PSA 0.24 06/18/2019   PSA 0.25 06/20/2018    no fam hx  No problems at all  No nocturia as a rule   BP Readings from Last 3 Encounters:  06/20/20 128/78  06/26/19 118/68  01/24/19 106/78   Pulse Readings from Last 3 Encounters:  06/20/20 70  06/26/19 (!) 57  01/24/19 72    H/o elevated glucose level Lab Results  Component Value Date   HGBA1C 5.9 06/17/2020  fasting 106  Cholesterol  Lab Results  Component Value Date   CHOL 193 06/17/2020   CHOL 182 06/18/2019   CHOL 179 06/20/2018   Lab Results  Component Value Date   HDL 64.90 06/17/2020   HDL 70.20 06/18/2019   HDL 68.30 06/20/2018   Lab Results  Component Value Date   LDLCALC 114 (H) 06/17/2020   LDLCALC 95 06/18/2019   LDLCALC 100 (H) 06/20/2018   Lab Results  Component  Value Date   TRIG 72.0 06/17/2020   TRIG 80.0 06/18/2019   TRIG 55.0 06/20/2018   Lab Results  Component Value Date   CHOLHDL 3 06/17/2020   CHOLHDL 3 06/18/2019   CHOLHDL 3 06/20/2018   Lab Results  Component Value Date   LDLDIRECT 120.9 11/24/2010   LDLDIRECT 132.0 01/14/2010  less exercise  Diet is pretty good  Some milk shakes     Other labs Lab Results  Component Value Date   WBC 3.6 (L) 06/17/2020   HGB 15.1 06/17/2020   HCT 45.7 06/17/2020   MCV 93.4 06/17/2020   PLT 246.0 06/17/2020   Lab Results  Component Value Date   TSH 2.21 06/17/2020    Lab Results  Component Value Date   CREATININE 1.03 06/17/2020   BUN 18 06/17/2020   NA 140 06/17/2020   K 4.2 06/17/2020   CL 104 06/17/2020   CO2 31 06/17/2020   Lab Results  Component Value Date   ALT 17 06/17/2020   AST 16 06/17/2020   ALKPHOS 108 06/17/2020   BILITOT 0.6 06/17/2020   Patient Active Problem List   Diagnosis Date Noted  . Bilateral primary osteoarthritis of hip 02/07/2019  . Meralgia paraesthetica, right 06/23/2018  . Elevated  glucose level 06/20/2017  . Lumbar back pain with radiculopathy affecting left lower extremity 06/17/2014  . Unspecified sleep apnea 03/28/2013  . Colon cancer screening 09/29/2011  . Routine general medical examination at a health care facility 11/23/2010  . Prostate cancer screening 11/23/2010  . ANXIETY, SITUATIONAL 01/21/2010  . TINNITUS 01/21/2010   Past Medical History:  Diagnosis Date  . Anxiety    Stress reaction  . Hyperlipidemia    borderline   Past Surgical History:  Procedure Laterality Date  . RHINOPLASTY    . VASECTOMY     Social History   Tobacco Use  . Smoking status: Never Smoker  . Smokeless tobacco: Never Used  Vaping Use  . Vaping Use: Never used  Substance Use Topics  . Alcohol use: Yes    Alcohol/week: 0.0 standard drinks    Comment: RARELY  . Drug use: No   Family History  Problem Relation Age of Onset  . Cancer  Other        Prostate CA in older years  . Heart disease Mother        CAD, bypass  . Diabetes Mother   . Cancer Father        Lymphoma   Allergies  Allergen Reactions  . Shellfish Allergy     REACTION: dizzy and nauseated   Current Outpatient Medications on File Prior to Visit  Medication Sig Dispense Refill  . Misc Natural Products (OSTEO BI-FLEX ADV DOUBLE ST PO) Take 1 tablet by mouth 2 (two) times daily.    . TURMERIC PO Take 2 tablets by mouth daily.     No current facility-administered medications on file prior to visit.     Review of Systems  Constitutional: Negative for activity change, appetite change, fatigue, fever and unexpected weight change.  HENT: Negative for congestion, rhinorrhea, sore throat and trouble swallowing.   Eyes: Negative for pain, redness, itching and visual disturbance.  Respiratory: Negative for cough, chest tightness, shortness of breath and wheezing.   Cardiovascular: Negative for chest pain and palpitations.  Gastrointestinal: Negative for abdominal pain, blood in stool, constipation, diarrhea and nausea.  Endocrine: Negative for cold intolerance, heat intolerance, polydipsia and polyuria.  Genitourinary: Negative for difficulty urinating, dysuria, frequency and urgency.  Musculoskeletal: Positive for arthralgias. Negative for joint swelling and myalgias.       Foot and heel pain   Skin: Negative for pallor and rash.  Neurological: Negative for dizziness, tremors, weakness, numbness and headaches.  Hematological: Negative for adenopathy. Does not bruise/bleed easily.  Psychiatric/Behavioral: Negative for decreased concentration and dysphoric mood. The patient is not nervous/anxious.        Objective:   Physical Exam Constitutional:      General: He is not in acute distress.    Appearance: Normal appearance. He is well-developed and normal weight. He is not ill-appearing or diaphoretic.  HENT:     Head: Normocephalic and atraumatic.      Right Ear: Tympanic membrane, ear canal and external ear normal.     Left Ear: Tympanic membrane, ear canal and external ear normal.     Nose: Nose normal. No congestion.     Mouth/Throat:     Mouth: Mucous membranes are moist.     Pharynx: Oropharynx is clear. No posterior oropharyngeal erythema.  Eyes:     General: No scleral icterus.       Right eye: No discharge.        Left eye: No discharge.     Conjunctiva/sclera:  Conjunctivae normal.     Pupils: Pupils are equal, round, and reactive to light.  Neck:     Thyroid: No thyromegaly.     Vascular: No carotid bruit or JVD.  Cardiovascular:     Rate and Rhythm: Normal rate and regular rhythm.     Pulses: Normal pulses.     Heart sounds: Normal heart sounds. No gallop.   Pulmonary:     Effort: Pulmonary effort is normal. No respiratory distress.     Breath sounds: Normal breath sounds. No wheezing or rales.     Comments: Good air exch Chest:     Chest wall: No tenderness.  Abdominal:     General: Bowel sounds are normal. There is no distension or abdominal bruit.     Palpations: Abdomen is soft. There is no mass.     Tenderness: There is no abdominal tenderness.     Hernia: No hernia is present.  Musculoskeletal:        General: No tenderness.     Cervical back: Normal range of motion and neck supple. No rigidity. No muscular tenderness.     Right lower leg: No edema.     Left lower leg: No edema.     Comments: No kyphosis   Lymphadenopathy:     Cervical: No cervical adenopathy.  Skin:    General: Skin is warm and dry.     Coloration: Skin is not pale.     Findings: No erythema or rash.     Comments: Solar lentigines diffusely Few sks  Raised rough nevus brown 2 mm L chest Pt goes to derm today for eval   Neurological:     Mental Status: He is alert.     Cranial Nerves: No cranial nerve deficit.     Motor: No abnormal muscle tone.     Coordination: Coordination normal.     Gait: Gait normal.     Deep Tendon  Reflexes: Reflexes are normal and symmetric. Reflexes normal.  Psychiatric:        Mood and Affect: Mood normal.        Cognition and Memory: Cognition and memory normal.     Comments: Leaving one job [employer]or, [employer]) and starting one closer to home            Assessment & Plan:   Problem List Items Addressed This Visit      Other   Routine general medical examination at a health care facility - Primary    Reviewed health habits including diet and exercise and skin cancer prevention Reviewed appropriate screening tests for age  Also reviewed health mt list, fam hx and immunization status , as well as social and family history   See HPI Labs reviewed  Given info on shingrix to consider  Declines covid and flu vaccines  cologuard neg 7/21  Dealing with foot problems-a bit less exercise  psa is stable        Prostate cancer screening    Lab Results  Component Value Date   PSA 0.29 06/17/2020   PSA 0.24 06/18/2019   PSA 0.25 06/20/2018    No fam hx of colon cancer  No voiding symptoms or nocturia      Colon cancer screening    Negative cologuard 7/21-utd Pt still declines screening colonoscopy for now      Elevated glucose level    Lab Results  Component Value Date   HGBA1C 5.9 06/17/2020   Fasting 106 Less exercise but still active  Will continue to follow

## 2020-06-20 NOTE — Assessment & Plan Note (Signed)
Lab Results  Component Value Date   HGBA1C 5.9 06/17/2020   Fasting 106 Less exercise but still active Will continue to follow

## 2020-06-20 NOTE — Assessment & Plan Note (Signed)
Lab Results  Component Value Date   PSA 0.29 06/17/2020   PSA 0.24 06/18/2019   PSA 0.25 06/20/2018    No fam hx of colon cancer  No voiding symptoms or nocturia

## 2020-06-20 NOTE — Assessment & Plan Note (Signed)
Negative cologuard 7/21-utd Pt still declines screening colonoscopy for now

## 2020-08-02 DIAGNOSIS — Z20822 Contact with and (suspected) exposure to covid-19: Secondary | ICD-10-CM | POA: Diagnosis not present

## 2020-08-05 DIAGNOSIS — R509 Fever, unspecified: Secondary | ICD-10-CM | POA: Diagnosis not present

## 2020-08-05 DIAGNOSIS — R5383 Other fatigue: Secondary | ICD-10-CM | POA: Diagnosis not present

## 2020-08-05 DIAGNOSIS — J029 Acute pharyngitis, unspecified: Secondary | ICD-10-CM | POA: Diagnosis not present

## 2020-08-05 DIAGNOSIS — U071 COVID-19: Secondary | ICD-10-CM | POA: Diagnosis not present

## 2020-08-09 DIAGNOSIS — Z20822 Contact with and (suspected) exposure to covid-19: Secondary | ICD-10-CM | POA: Diagnosis not present

## 2020-08-12 DIAGNOSIS — Z20822 Contact with and (suspected) exposure to covid-19: Secondary | ICD-10-CM | POA: Diagnosis not present

## 2020-08-27 DIAGNOSIS — H40013 Open angle with borderline findings, low risk, bilateral: Secondary | ICD-10-CM | POA: Diagnosis not present

## 2021-04-22 DIAGNOSIS — M25562 Pain in left knee: Secondary | ICD-10-CM | POA: Diagnosis not present

## 2021-04-22 DIAGNOSIS — M25561 Pain in right knee: Secondary | ICD-10-CM | POA: Diagnosis not present

## 2021-05-25 DIAGNOSIS — M79602 Pain in left arm: Secondary | ICD-10-CM | POA: Diagnosis not present

## 2021-05-25 DIAGNOSIS — M25561 Pain in right knee: Secondary | ICD-10-CM | POA: Diagnosis not present

## 2021-05-25 DIAGNOSIS — M9901 Segmental and somatic dysfunction of cervical region: Secondary | ICD-10-CM | POA: Diagnosis not present

## 2021-05-25 DIAGNOSIS — M9906 Segmental and somatic dysfunction of lower extremity: Secondary | ICD-10-CM | POA: Diagnosis not present

## 2021-06-14 ENCOUNTER — Telehealth: Payer: Self-pay | Admitting: Family Medicine

## 2021-06-14 DIAGNOSIS — Z0000 Encounter for general adult medical examination without abnormal findings: Secondary | ICD-10-CM

## 2021-06-14 DIAGNOSIS — R7309 Other abnormal glucose: Secondary | ICD-10-CM

## 2021-06-14 DIAGNOSIS — Z125 Encounter for screening for malignant neoplasm of prostate: Secondary | ICD-10-CM

## 2021-06-14 NOTE — Telephone Encounter (Signed)
-----   Message from Velna Hatchet, RT sent at 06/01/2021  8:21 AM EDT ----- ?Regarding: Lab Mon 06/15/21 ?Patient is scheduled for cpx, please order future labs.  Thanks, Anda Kraft  ? ?

## 2021-06-15 ENCOUNTER — Other Ambulatory Visit (INDEPENDENT_AMBULATORY_CARE_PROVIDER_SITE_OTHER): Payer: BC Managed Care – PPO

## 2021-06-15 DIAGNOSIS — Z0000 Encounter for general adult medical examination without abnormal findings: Secondary | ICD-10-CM

## 2021-06-15 DIAGNOSIS — R7309 Other abnormal glucose: Secondary | ICD-10-CM

## 2021-06-15 DIAGNOSIS — Z125 Encounter for screening for malignant neoplasm of prostate: Secondary | ICD-10-CM | POA: Diagnosis not present

## 2021-06-15 LAB — HEMOGLOBIN A1C: Hgb A1c MFr Bld: 5.7 % (ref 4.6–6.5)

## 2021-06-15 LAB — CBC WITH DIFFERENTIAL/PLATELET
Basophils Absolute: 0 10*3/uL (ref 0.0–0.1)
Basophils Relative: 1.1 % (ref 0.0–3.0)
Eosinophils Absolute: 0.2 10*3/uL (ref 0.0–0.7)
Eosinophils Relative: 6.4 % — ABNORMAL HIGH (ref 0.0–5.0)
HCT: 42.7 % (ref 39.0–52.0)
Hemoglobin: 14.4 g/dL (ref 13.0–17.0)
Lymphocytes Relative: 33.6 % (ref 12.0–46.0)
Lymphs Abs: 1.1 10*3/uL (ref 0.7–4.0)
MCHC: 33.7 g/dL (ref 30.0–36.0)
MCV: 94.3 fl (ref 78.0–100.0)
Monocytes Absolute: 0.4 10*3/uL (ref 0.1–1.0)
Monocytes Relative: 12.1 % — ABNORMAL HIGH (ref 3.0–12.0)
Neutro Abs: 1.6 10*3/uL (ref 1.4–7.7)
Neutrophils Relative %: 46.8 % (ref 43.0–77.0)
Platelets: 247 10*3/uL (ref 150.0–400.0)
RBC: 4.52 Mil/uL (ref 4.22–5.81)
RDW: 13.5 % (ref 11.5–15.5)
WBC: 3.3 10*3/uL — ABNORMAL LOW (ref 4.0–10.5)

## 2021-06-15 LAB — LIPID PANEL
Cholesterol: 189 mg/dL (ref 0–200)
HDL: 70.9 mg/dL (ref >39.00)
LDL Cholesterol: 106 mg/dL — ABNORMAL HIGH (ref 0–99)
NonHDL: 118.53
Total CHOL/HDL Ratio: 3
Triglycerides: 64 mg/dL (ref 0.0–149.0)
VLDL: 12.8 mg/dL (ref 0.0–40.0)

## 2021-06-15 LAB — COMPREHENSIVE METABOLIC PANEL
ALT: 26 U/L (ref 0–53)
AST: 26 U/L (ref 0–37)
Albumin: 4.2 g/dL (ref 3.5–5.2)
Alkaline Phosphatase: 128 U/L — ABNORMAL HIGH (ref 39–117)
BUN: 17 mg/dL (ref 6–23)
CO2: 28 mEq/L (ref 19–32)
Calcium: 9.3 mg/dL (ref 8.4–10.5)
Chloride: 106 mEq/L (ref 96–112)
Creatinine, Ser: 0.9 mg/dL (ref 0.40–1.50)
GFR: 91.6 mL/min (ref >60.00)
Glucose, Bld: 102 mg/dL — ABNORMAL HIGH (ref 70–99)
Potassium: 4.2 mEq/L (ref 3.5–5.1)
Sodium: 141 mEq/L (ref 135–145)
Total Bilirubin: 0.5 mg/dL (ref 0.2–1.2)
Total Protein: 6.2 g/dL (ref 6.0–8.3)

## 2021-06-15 LAB — PSA: PSA: 0.23 ng/mL (ref 0.10–4.00)

## 2021-06-15 LAB — TSH: TSH: 2.7 u[IU]/mL (ref 0.35–5.50)

## 2021-06-22 ENCOUNTER — Ambulatory Visit (INDEPENDENT_AMBULATORY_CARE_PROVIDER_SITE_OTHER): Payer: BC Managed Care – PPO | Admitting: Family Medicine

## 2021-06-22 ENCOUNTER — Encounter: Payer: Self-pay | Admitting: Family Medicine

## 2021-06-22 VITALS — BP 124/68 | HR 64 | Temp 97.7°F | Ht 67.5 in | Wt 156.0 lb

## 2021-06-22 DIAGNOSIS — R7309 Other abnormal glucose: Secondary | ICD-10-CM | POA: Diagnosis not present

## 2021-06-22 DIAGNOSIS — Z1211 Encounter for screening for malignant neoplasm of colon: Secondary | ICD-10-CM | POA: Diagnosis not present

## 2021-06-22 DIAGNOSIS — Z0000 Encounter for general adult medical examination without abnormal findings: Secondary | ICD-10-CM

## 2021-06-22 DIAGNOSIS — Z125 Encounter for screening for malignant neoplasm of prostate: Secondary | ICD-10-CM | POA: Diagnosis not present

## 2021-06-22 NOTE — Progress Notes (Signed)
Subjective:    Patient ID: Patrick Mcpherson, male    DOB: November 04, 1960, 61 y.o.   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  Getting stronger  Diet is fair/maybe not quite as good the last few weeks  Weight is stable   Immunization History  Administered Date(s) Administered   Influenza Split 12/02/2010   Influenza Whole 02/04/2010   Td 01/02/2003   Tdap 10/04/2012   Declines covid and flu vaccines  Zoster status: not interested in vaccine   Cologuard nl 08/2019 Declines colonoscopy   No stool changes   Prostate health Lab Results  Component Value Date   PSA 0.23 06/15/2021   PSA 0.29 06/17/2020   PSA 0.24 06/18/2019   No changes in urination  Nocturia is not often   Prostate cancer- none in family    BP Readings from Last 3 Encounters:  06/22/21 124/68  06/20/20 128/78  06/26/19 118/68   Pulse Readings from Last 3 Encounters:  06/22/21 64  06/20/20 70  06/26/19 (!) 57    Elevated glucose level in the past Lab Results  Component Value Date   HGBA1C 5.7 06/15/2021   Other labs   Cholesterol Lab Results  Component Value Date   CHOL 189 06/15/2021   CHOL 193 06/17/2020   CHOL 182 06/18/2019   Lab Results  Component Value Date   HDL 70.90 06/15/2021   HDL 64.90 06/17/2020   HDL 70.20 06/18/2019   Lab Results  Component Value Date   LDLCALC 106 (H) 06/15/2021   LDLCALC 114 (H) 06/17/2020   LDLCALC 95 06/18/2019   Lab Results  Component Value Date   TRIG 64.0 06/15/2021   TRIG 72.0  06/17/2020   TRIG 80.0 06/18/2019   Lab Results  Component Value Date   CHOLHDL 3 06/15/2021   CHOLHDL 3 06/17/2020   CHOLHDL 3 06/18/2019   Lab Results  Component Value Date   LDLDIRECT 120.9 11/24/2010   LDLDIRECT 132.0 01/14/2010   Improved  Eating is about the same   Lab Results  Component Value Date   CREATININE 0.90 06/15/2021   BUN 17 06/15/2021   NA 141 06/15/2021   K 4.2 06/15/2021   CL 106 06/15/2021   CO2 28 06/15/2021    Lab Results  Component Value Date   ALT 26 06/15/2021   AST 26 06/15/2021   ALKPHOS 128 (H) 06/15/2021   BILITOT 0.5 06/15/2021   Lab Results  Component Value Date   WBC 3.3 (L) 06/15/2021   HGB 14.4 06/15/2021   HCT 42.7 06/15/2021   MCV 94.3 06/15/2021   PLT 247.0 06/15/2021   Lab Results  Component Value Date   TSH 2.70 06/15/2021   Patient Active Problem List   Diagnosis Date Noted   Bilateral primary osteoarthritis of hip 02/07/2019   Meralgia paraesthetica, right 06/23/2018   Elevated glucose level 06/20/2017   Lumbar back pain with radiculopathy affecting  left lower extremity 06/17/2014   Unspecified sleep apnea 03/28/2013   Colon cancer screening 09/29/2011   Routine general medical examination at a health care facility 11/23/2010   Prostate cancer screening 11/23/2010   ANXIETY, SITUATIONAL 01/21/2010   TINNITUS 01/21/2010   Past Medical History:  Diagnosis Date   Anxiety    Stress reaction   Hyperlipidemia    borderline   Past Surgical History:  Procedure Laterality Date   RHINOPLASTY     VASECTOMY     Social History   Tobacco Use   Smoking status: Never   Smokeless tobacco: Never  Vaping Use   Vaping Use: Never used  Substance Use Topics   Alcohol use: Yes    Alcohol/week: 0.0 standard drinks    Comment: RARELY   Drug use: No   Family History  Problem Relation Age of Onset   Heart disease Mother        CAD, bypass   Diabetes Mother    Cancer Father        Lymphoma   Allergies  Allergen  Reactions   Shellfish Allergy     REACTION: dizzy and nauseated   Current Outpatient Medications on File Prior to Visit  Medication Sig Dispense Refill   meloxicam (MOBIC) 15 MG tablet Take 15 mg by mouth daily as needed.     Misc Natural Products (OSTEO BI-FLEX ADV DOUBLE ST PO) Take 1 tablet by mouth 2 (two) times daily.     TURMERIC PO Take 2 tablets by mouth daily.     No current facility-administered medications on file prior to visit.     Review of Systems  Constitutional:  Negative for activity change, appetite change, fatigue, fever and unexpected weight change.  HENT:  Negative for congestion, rhinorrhea, sore throat and trouble swallowing.   Eyes:  Negative for pain, redness, itching and visual disturbance.  Respiratory:  Negative for cough, chest tightness, shortness of breath and wheezing.   Cardiovascular:  Negative for chest pain and palpitations.  Gastrointestinal:  Negative for abdominal pain, blood in stool, constipation, diarrhea and nausea.  Endocrine: Negative for cold intolerance, heat intolerance, polydipsia and polyuria.  Genitourinary:  Negative for difficulty urinating, dysuria, frequency and urgency.  Musculoskeletal:  Negative for arthralgias, joint swelling and myalgias.  Skin:  Negative for pallor and rash.  Neurological:  Negative for dizziness, tremors, weakness, numbness and headaches.  Hematological:  Negative for adenopathy. Does not bruise/bleed easily.  Psychiatric/Behavioral:  Negative for decreased concentration and dysphoric mood. The patient is not nervous/anxious.       Objective:   Physical Exam Constitutional:      General: He is not in acute distress.    Appearance: Normal appearance. He is well-developed and normal weight. He is not ill-appearing or diaphoretic.  HENT:     Head: Normocephalic and atraumatic.     Right Ear: Tympanic membrane, ear canal and external ear normal.     Left Ear: Tympanic membrane, ear canal and external ear  normal.     Nose: Nose normal. No congestion.     Mouth/Throat:     Mouth: Mucous membranes are moist.     Pharynx: Oropharynx is clear. No posterior oropharyngeal erythema.  Eyes:     General: No scleral icterus.       Right eye: No discharge.        Left eye: No discharge.     Conjunctiva/sclera: Conjunctivae normal.     Pupils: Pupils are equal, round, and reactive to  light.  Neck:     Thyroid: No thyromegaly.     Vascular: No carotid bruit or JVD.  Cardiovascular:     Rate and Rhythm: Normal rate and regular rhythm.     Pulses: Normal pulses.     Heart sounds: Normal heart sounds.    No gallop.  Pulmonary:     Effort: Pulmonary effort is normal. No respiratory distress.     Breath sounds: Normal breath sounds. No wheezing or rales.     Comments: Good air exch Chest:     Chest wall: No tenderness.  Abdominal:     General: Bowel sounds are normal. There is no distension or abdominal bruit.     Palpations: Abdomen is soft. There is no mass.     Tenderness: There is no abdominal tenderness.     Hernia: No hernia is present.  Musculoskeletal:        General: No tenderness.     Cervical back: Normal range of motion and neck supple. No rigidity. No muscular tenderness.     Right lower leg: No edema.     Left lower leg: No edema.  Lymphadenopathy:     Cervical: No cervical adenopathy.  Skin:    General: Skin is warm and dry.     Coloration: Skin is not pale.     Findings: No erythema or rash.     Comments: Solar lentigines diffusely  Scattered SKs  Treated AK on R cheek   Neurological:     Mental Status: He is alert.     Cranial Nerves: No cranial nerve deficit.     Motor: No abnormal muscle tone.     Coordination: Coordination normal.     Gait: Gait normal.     Deep Tendon Reflexes: Reflexes are normal and symmetric. Reflexes normal.  Psychiatric:        Attention and Perception: Attention normal.        Mood and Affect: Mood normal.        Cognition and Memory:  Cognition and memory normal.     Comments: pleasant          Assessment & Plan:   Problem List Items Addressed This Visit       Other   Colon cancer screening    Negative cologuard from 2021 , good for 3 y Declines colonoscopy  No stool changes        Elevated glucose level    Lab Results  Component Value Date   HGBA1C 5.7 06/15/2021  Controlled with diet  disc imp of low glycemic diet and wt loss to prevent DM2       Prostate cancer screening    Lab Results  Component Value Date   PSA 0.23 06/15/2021   PSA 0.29 06/17/2020   PSA 0.24 06/18/2019   No family history  No voiding changes or nocturia       Routine general medical examination at a health care facility - Primary    Reviewed health habits including diet and exercise and skin cancer prevention Reviewed appropriate screening tests for age  Also reviewed health mt list, fam hx and immunization status , as well as social and family history   See HPI Labs reviewed  cologuard utd 2021 (declines coloscopy) Declines shingrix vaccine but may consider later Psa is low and stable  Using sun protection  Good exercise habits

## 2021-06-22 NOTE — Assessment & Plan Note (Signed)
Reviewed health habits including diet and exercise and skin cancer prevention Reviewed appropriate screening tests for age  Also reviewed health mt list, fam hx and immunization status , as well as social and family history   See HPI Labs reviewed  cologuard utd 2021 (declines coloscopy) Declines shingrix vaccine but may consider later Psa is low and stable  Using sun protection  Good exercise habits

## 2021-06-22 NOTE — Assessment & Plan Note (Signed)
Lab Results  Component Value Date   HGBA1C 5.7 06/15/2021   Controlled with diet  disc imp of low glycemic diet and wt loss to prevent DM2

## 2021-06-22 NOTE — Assessment & Plan Note (Signed)
Negative cologuard from 2021 , good for 1 y Declines colonoscopy  No stool changes

## 2021-06-22 NOTE — Assessment & Plan Note (Signed)
Lab Results  Component Value Date   PSA 0.23 06/15/2021   PSA 0.29 06/17/2020   PSA 0.24 06/18/2019    No family history  No voiding changes or nocturia

## 2021-06-22 NOTE — Patient Instructions (Signed)
Take care of yourself  Consider the shingrix vaccine  Stay active  Use sun protection  Eat a healthy balanced diet

## 2021-09-02 DIAGNOSIS — H40013 Open angle with borderline findings, low risk, bilateral: Secondary | ICD-10-CM | POA: Diagnosis not present

## 2021-11-08 IMAGING — DX DG LUMBAR SPINE COMPLETE 4+V
5 series · 5 of 5 positions shown · non-contrast
Comparison: 06/20/2014

CLINICAL DATA: Low back pain

EXAM:
LUMBAR SPINE - COMPLETE 4+ VIEW

[l-spine ap]
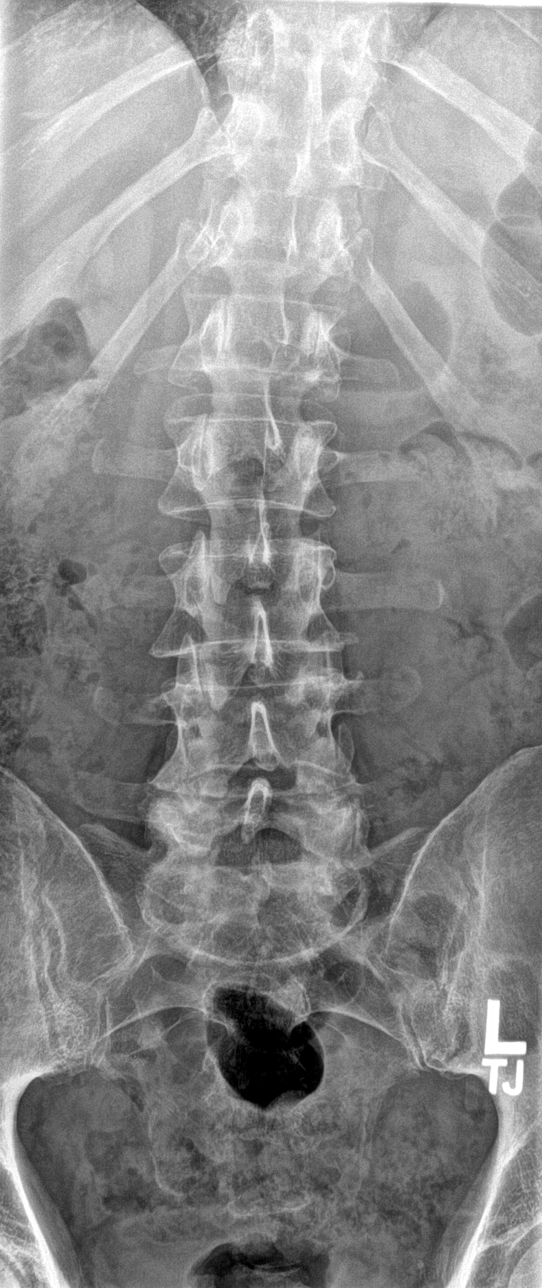

[l-spine obl (1 of 2)]
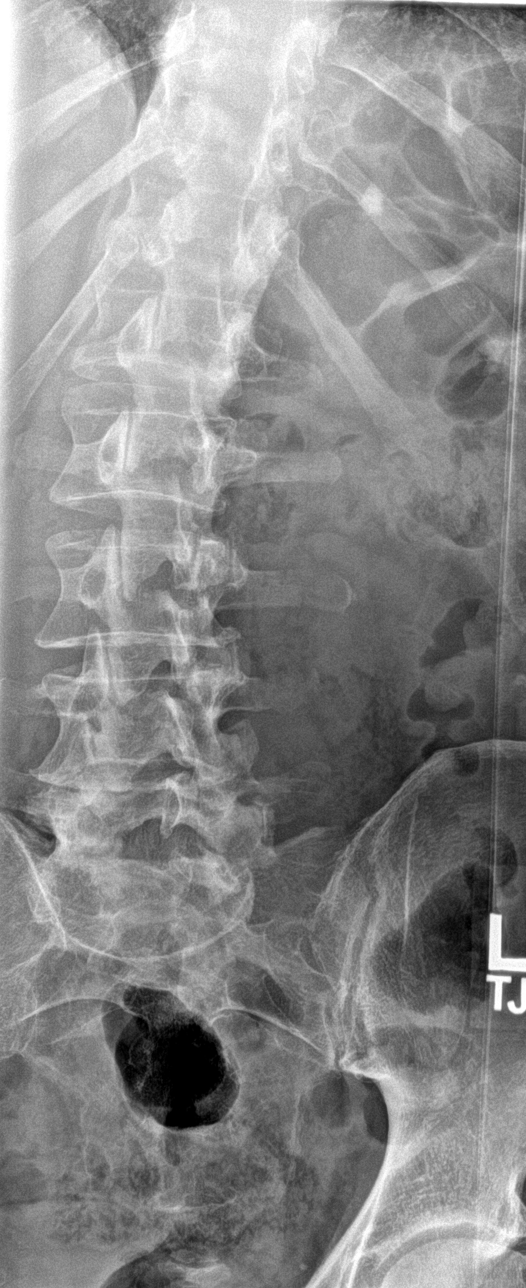

[l-spine obl (2 of 2)]
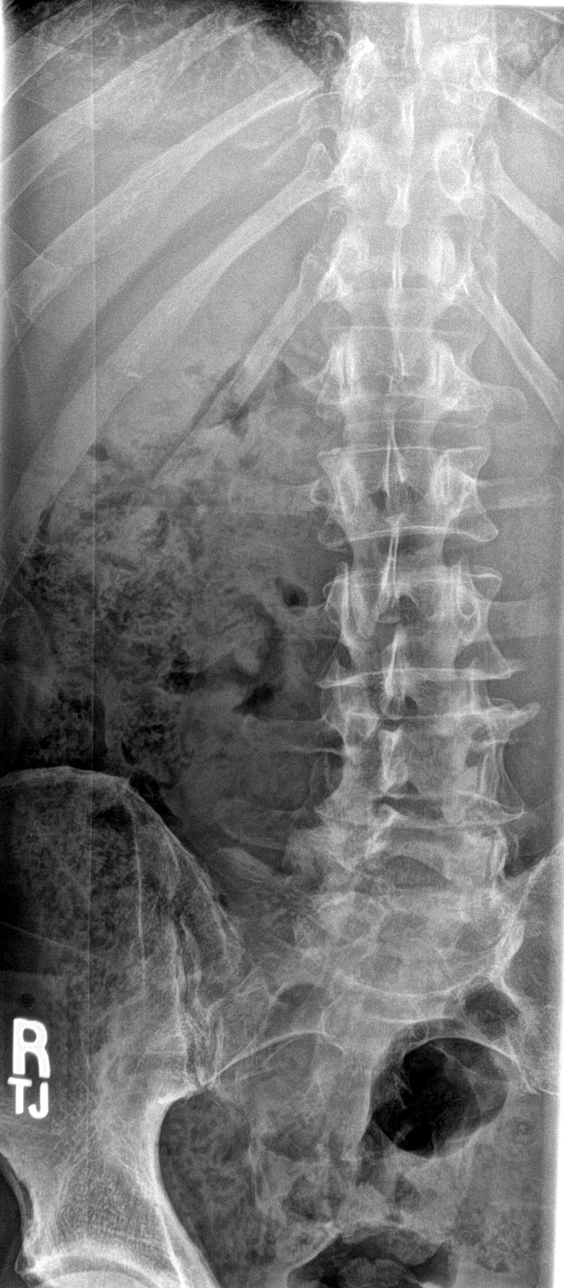

[l-spine lat]
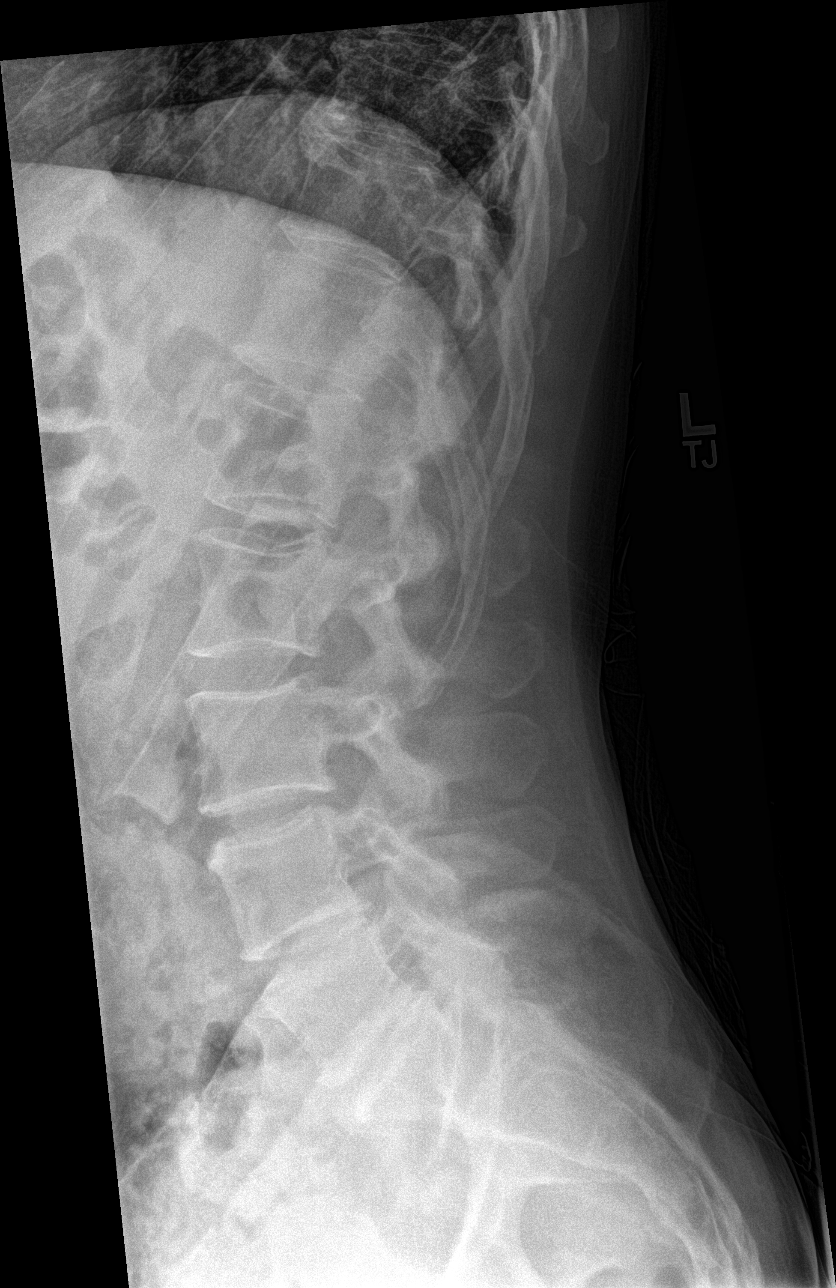

[l-spine l5/s1]
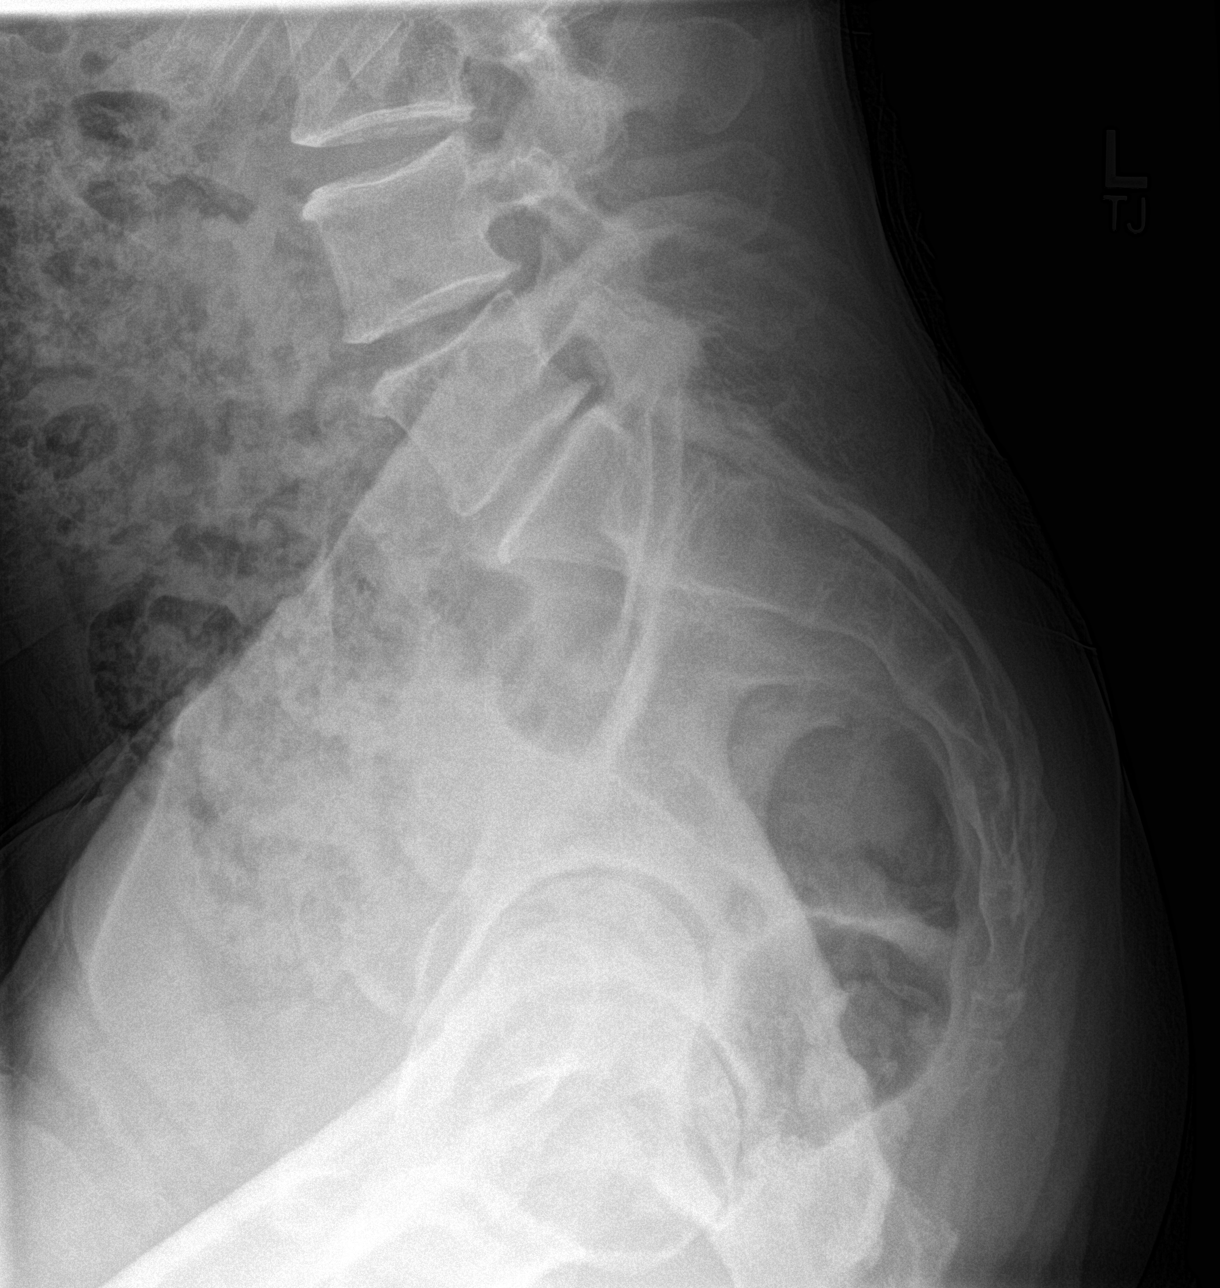

[5 of 5 positions shown; findings below may reference images not displayed]

FINDINGS: Degenerative disc and facet disease in the lower lumbar spine.
Normal alignment. No fracture. SI joints symmetric and unremarkable.
IMPRESSION: Degenerative disc and facet disease in the lower lumbar spine. No
acute bony abnormality.

## 2022-05-24 ENCOUNTER — Encounter: Payer: Self-pay | Admitting: Family Medicine

## 2022-05-24 DIAGNOSIS — Z125 Encounter for screening for malignant neoplasm of prostate: Secondary | ICD-10-CM

## 2022-05-24 DIAGNOSIS — Z9189 Other specified personal risk factors, not elsewhere classified: Secondary | ICD-10-CM

## 2022-05-24 DIAGNOSIS — R4589 Other symptoms and signs involving emotional state: Secondary | ICD-10-CM

## 2022-05-24 DIAGNOSIS — R7309 Other abnormal glucose: Secondary | ICD-10-CM

## 2022-05-24 DIAGNOSIS — Z0000 Encounter for general adult medical examination without abnormal findings: Secondary | ICD-10-CM

## 2022-05-26 DIAGNOSIS — Z9189 Other specified personal risk factors, not elsewhere classified: Secondary | ICD-10-CM | POA: Insufficient documentation

## 2022-05-26 DIAGNOSIS — R4589 Other symptoms and signs involving emotional state: Secondary | ICD-10-CM | POA: Insufficient documentation

## 2022-06-17 ENCOUNTER — Other Ambulatory Visit (INDEPENDENT_AMBULATORY_CARE_PROVIDER_SITE_OTHER): Payer: BC Managed Care – PPO

## 2022-06-17 DIAGNOSIS — Z9189 Other specified personal risk factors, not elsewhere classified: Secondary | ICD-10-CM | POA: Diagnosis not present

## 2022-06-17 DIAGNOSIS — R7309 Other abnormal glucose: Secondary | ICD-10-CM | POA: Diagnosis not present

## 2022-06-17 DIAGNOSIS — R4589 Other symptoms and signs involving emotional state: Secondary | ICD-10-CM

## 2022-06-17 DIAGNOSIS — Z125 Encounter for screening for malignant neoplasm of prostate: Secondary | ICD-10-CM

## 2022-06-17 DIAGNOSIS — Z0000 Encounter for general adult medical examination without abnormal findings: Secondary | ICD-10-CM | POA: Diagnosis not present

## 2022-06-17 LAB — LIPID PANEL
Cholesterol: 185 mg/dL (ref 0–200)
HDL: 70.3 mg/dL (ref >39.00)
LDL Cholesterol: 100 mg/dL — ABNORMAL HIGH (ref 0–99)
NonHDL: 114.24
Total CHOL/HDL Ratio: 3
Triglycerides: 70 mg/dL (ref 0.0–149.0)
VLDL: 14 mg/dL (ref 0.0–40.0)

## 2022-06-17 LAB — CBC WITH DIFFERENTIAL/PLATELET
Basophils Absolute: 0 10*3/uL (ref 0.0–0.1)
Basophils Relative: 0.8 % (ref 0.0–3.0)
Eosinophils Absolute: 0.1 10*3/uL (ref 0.0–0.7)
Eosinophils Relative: 4.1 % (ref 0.0–5.0)
HCT: 45.5 % (ref 39.0–52.0)
Hemoglobin: 15.5 g/dL (ref 13.0–17.0)
Lymphocytes Relative: 30.3 % (ref 12.0–46.0)
Lymphs Abs: 1.1 10*3/uL (ref 0.7–4.0)
MCHC: 34.1 g/dL (ref 30.0–36.0)
MCV: 94.3 fl (ref 78.0–100.0)
Monocytes Absolute: 0.4 10*3/uL (ref 0.1–1.0)
Monocytes Relative: 10.5 % (ref 3.0–12.0)
Neutro Abs: 1.9 10*3/uL (ref 1.4–7.7)
Neutrophils Relative %: 54.3 % (ref 43.0–77.0)
Platelets: 269 10*3/uL (ref 150.0–400.0)
RBC: 4.82 Mil/uL (ref 4.22–5.81)
RDW: 13.8 % (ref 11.5–15.5)
WBC: 3.5 10*3/uL — ABNORMAL LOW (ref 4.0–10.5)

## 2022-06-17 LAB — HEMOGLOBIN A1C: Hgb A1c MFr Bld: 5.7 % (ref 4.6–6.5)

## 2022-06-17 LAB — COMPREHENSIVE METABOLIC PANEL
ALT: 19 U/L (ref 0–53)
AST: 19 U/L (ref 0–37)
Albumin: 4.2 g/dL (ref 3.5–5.2)
Alkaline Phosphatase: 111 U/L (ref 39–117)
BUN: 17 mg/dL (ref 6–23)
CO2: 31 mEq/L (ref 19–32)
Calcium: 9.3 mg/dL (ref 8.4–10.5)
Chloride: 103 mEq/L (ref 96–112)
Creatinine, Ser: 0.97 mg/dL (ref 0.40–1.50)
GFR: 83.13 mL/min (ref >60.00)
Glucose, Bld: 105 mg/dL — ABNORMAL HIGH (ref 70–99)
Potassium: 4.2 mEq/L (ref 3.5–5.1)
Sodium: 140 mEq/L (ref 135–145)
Total Bilirubin: 0.7 mg/dL (ref 0.2–1.2)
Total Protein: 6.5 g/dL (ref 6.0–8.3)

## 2022-06-17 LAB — TESTOSTERONE: Testosterone: 505.22 ng/dL (ref 300.00–890.00)

## 2022-06-17 LAB — TSH: TSH: 2.37 u[IU]/mL (ref 0.35–5.50)

## 2022-06-17 LAB — PSA: PSA: 0.27 ng/mL (ref 0.10–4.00)

## 2022-06-25 ENCOUNTER — Ambulatory Visit (INDEPENDENT_AMBULATORY_CARE_PROVIDER_SITE_OTHER): Payer: BC Managed Care – PPO | Admitting: Family Medicine

## 2022-06-25 ENCOUNTER — Encounter: Payer: BC Managed Care – PPO | Admitting: Family Medicine

## 2022-06-25 ENCOUNTER — Encounter: Payer: Self-pay | Admitting: Family Medicine

## 2022-06-25 VITALS — BP 130/68 | HR 65 | Temp 97.8°F | Ht 67.25 in | Wt 153.0 lb

## 2022-06-25 DIAGNOSIS — R7309 Other abnormal glucose: Secondary | ICD-10-CM | POA: Diagnosis not present

## 2022-06-25 DIAGNOSIS — Z1211 Encounter for screening for malignant neoplasm of colon: Secondary | ICD-10-CM

## 2022-06-25 DIAGNOSIS — Z9189 Other specified personal risk factors, not elsewhere classified: Secondary | ICD-10-CM

## 2022-06-25 DIAGNOSIS — Z0000 Encounter for general adult medical examination without abnormal findings: Secondary | ICD-10-CM | POA: Diagnosis not present

## 2022-06-25 DIAGNOSIS — Z125 Encounter for screening for malignant neoplasm of prostate: Secondary | ICD-10-CM | POA: Diagnosis not present

## 2022-06-25 DIAGNOSIS — R4589 Other symptoms and signs involving emotional state: Secondary | ICD-10-CM

## 2022-06-25 NOTE — Progress Notes (Signed)
Subjective:    Patient ID: Patrick Mcpherson, male    DOB: December 13, 1960, 62 y.o.   MRN: [id number]  HPI Here for health maintenance exam and to review chronic medical problems    Wt Readings from Last 3 Encounters:  06/25/22 153 lb (69.4 kg)  06/22/21 156 lb (70.8 kg)  06/20/20 158 lb 8 oz (71.9 kg)   23.79 kg/m  Vitals:   06/25/22 1528  BP: 130/68  Pulse: 65  Temp: 97.8 F (36.6 C)  SpO2: 96%   Immunization History  Administered Date(s) Administered   Influenza Split 12/02/2010   Influenza Whole 02/04/2010   Td 01/02/2003   Tdap 10/04/2012   There are no preventive care reminders to display for this patient.  Doing ok overall   Cologuard neg 08/2019   Prostate health Lab Results  Component Value Date   PSA 0.27 06/17/2022   PSA 0.23 06/15/2021   PSA 0.29 06/17/2020   No problems with urination  No nocturia    Shingrix vaccine - may be interested later   Mood Noted mild depression/ low motivation  Some fatigue   Stress- in middle of many home renovations  Pushing himself too hard   Wants to get started swimming again  Would like to run a bit if his body would let him  Time and motivation are factors   Took up the drums - injoys that    Perhaps because his exercise has changed   Has oral device for snoring  Never dx with formal OSA    Asked for testosterone level This was 505.2  in the normal range      06/25/2022    4:07 PM 06/22/2021   11:38 AM 06/20/2020    8:51 AM 06/26/2019    5:02 PM 06/20/2017   12:50 PM  Depression screen PHQ 2/9  Decreased Interest 1 0 0 0 1  Down, Depressed, Hopeless 1 0 0 0 1  PHQ - 2 Score 2 0 0 0 2  Altered sleeping 1 1 2 2 3   Tired, decreased energy 1 1 0 1 1  Change in appetite 0 0 0 0 0  Feeling bad or failure about yourself  0 1 1 1 1   Trouble concentrating 0 0 0 0 0  Moving slowly or fidgety/restless 0 0 0 0 0  Suicidal thoughts 0 0 0 0 0  PHQ-9 Score 4 3 3 4 7   Difficult doing work/chores  Somewhat difficult Not difficult at all Not difficult at all         H/o elevated glucose level Lab Results  Component Value Date   HGBA1C 5.7 06/17/2022  Stable  Glucose of 105  Does eat well Not a lot of added sugar  Not a lot of processed carbs (thought more than in years past)    Cholesterol Lab Results  Component Value Date   CHOL 185 06/17/2022   CHOL 189 06/15/2021   CHOL 193 06/17/2020   Lab Results  Component Value Date   HDL 70.30 06/17/2022   HDL 70.90 06/15/2021   HDL 64.90 06/17/2020   Lab Results  Component Value Date   LDLCALC 100 (H) 06/17/2022   LDLCALC 106 (H) 06/15/2021   LDLCALC 114 (H) 06/17/2020   Lab Results  Component Value Date   TRIG 70.0 06/17/2022   TRIG 64.0 06/15/2021   TRIG 72.0 06/17/2020   Lab Results  Component Value Date   CHOLHDL 3 06/17/2022   CHOLHDL 3 06/15/2021  CHOLHDL 3 06/17/2020   Lab Results  Component Value Date   LDLDIRECT 120.9 11/24/2010   LDLDIRECT 132.0 01/14/2010   Mother had CABG  Father had lymphoma   Lab Results  Component Value Date   WBC 3.5 (L) 06/17/2022   HGB 15.5 06/17/2022   HCT 45.5 06/17/2022   MCV 94.3 06/17/2022   PLT 269.0 06/17/2022       Chemistry      Component Value Date/Time   NA 140 06/17/2022 0816   K 4.2 06/17/2022 0816   CL 103 06/17/2022 0816   CO2 31 06/17/2022 0816   BUN 17 06/17/2022 0816   CREATININE 0.97 06/17/2022 0816      Component Value Date/Time   CALCIUM 9.3 06/17/2022 0816   ALKPHOS 111 06/17/2022 0816   AST 19 06/17/2022 0816   ALT 19 06/17/2022 0816   BILITOT 0.7 06/17/2022 0816     Lab Results  Component Value Date   TSH 2.37 06/17/2022   Patient Active Problem List   Diagnosis Date Noted   Depressed mood 05/26/2022   Lack of motivation 05/26/2022   Bilateral primary osteoarthritis of hip 02/07/2019   Elevated glucose level 06/20/2017   Lumbar back pain with radiculopathy affecting left lower extremity 06/17/2014   Colon cancer  screening 09/29/2011   Routine general medical examination at a health care facility 11/23/2010   Prostate cancer screening 11/23/2010   TINNITUS 01/21/2010   Past Medical History:  Diagnosis Date   Anxiety    Stress reaction   Hyperlipidemia    borderline   Past Surgical History:  Procedure Laterality Date   RHINOPLASTY     VASECTOMY     Social History   Tobacco Use   Smoking status: Never   Smokeless tobacco: Never  Vaping Use   Vaping Use: Never used  Substance Use Topics   Alcohol use: Yes    Alcohol/week: 0.0 standard drinks of alcohol    Comment: RARELY   Drug use: No   Family History  Problem Relation Age of Onset   Heart disease Mother        CAD, bypass   Diabetes Mother    Cancer Father        Lymphoma   Allergies  Allergen Reactions   Shellfish Allergy     REACTION: dizzy and nauseated   Current Outpatient Medications on File Prior to Visit  Medication Sig Dispense Refill   meloxicam (MOBIC) 15 MG tablet Take 15 mg by mouth daily as needed.     Misc Natural Products (OSTEO BI-FLEX ADV DOUBLE ST PO) Take 1 tablet by mouth 2 (two) times daily.     TURMERIC PO Take 2 tablets by mouth daily.     No current facility-administered medications on file prior to visit.      Review of Systems  Constitutional:  Positive for fatigue. Negative for activity change, appetite change, fever and unexpected weight change.  HENT:  Negative for congestion, rhinorrhea, sore throat and trouble swallowing.   Eyes:  Negative for pain, redness, itching and visual disturbance.  Respiratory:  Negative for cough, chest tightness, shortness of breath and wheezing.   Cardiovascular:  Negative for chest pain and palpitations.  Gastrointestinal:  Negative for abdominal pain, blood in stool, constipation, diarrhea and nausea.  Endocrine: Negative for cold intolerance, heat intolerance, polydipsia and polyuria.  Genitourinary:  Negative for difficulty urinating, dysuria, frequency  and urgency.  Musculoskeletal:  Negative for arthralgias, joint swelling and myalgias.  Skin:  Negative for pallor and rash.  Neurological:  Negative for dizziness, tremors, weakness, numbness and headaches.  Hematological:  Negative for adenopathy. Does not bruise/bleed easily.  Psychiatric/Behavioral:  Positive for dysphoric mood. Negative for decreased concentration. The patient is not nervous/anxious.        Objective:   Physical Exam Constitutional:      General: He is not in acute distress.    Appearance: Normal appearance. He is well-developed and normal weight. He is not ill-appearing or diaphoretic.  HENT:     Head: Normocephalic and atraumatic.     Right Ear: Tympanic membrane, ear canal and external ear normal.     Left Ear: Tympanic membrane, ear canal and external ear normal.     Nose: Nose normal. No congestion.     Mouth/Throat:     Mouth: Mucous membranes are moist.     Pharynx: Oropharynx is clear. No posterior oropharyngeal erythema.  Eyes:     General: No scleral icterus.       Right eye: No discharge.        Left eye: No discharge.     Conjunctiva/sclera: Conjunctivae normal.     Pupils: Pupils are equal, round, and reactive to light.  Neck:     Thyroid: No thyromegaly.     Vascular: No carotid bruit or JVD.  Cardiovascular:     Rate and Rhythm: Normal rate and regular rhythm.     Pulses: Normal pulses.     Heart sounds: Normal heart sounds.     No gallop.  Pulmonary:     Effort: Pulmonary effort is normal. No respiratory distress.     Breath sounds: Normal breath sounds. No wheezing or rales.     Comments: Good air exch Chest:     Chest wall: No tenderness.  Abdominal:     General: Bowel sounds are normal. There is no distension or abdominal bruit.     Palpations: Abdomen is soft. There is no mass.     Tenderness: There is no abdominal tenderness.     Hernia: No hernia is present.  Musculoskeletal:        General: No tenderness.     Cervical back:  Normal range of motion and neck supple. No rigidity. No muscular tenderness.     Right lower leg: No edema.     Left lower leg: No edema.  Lymphadenopathy:     Cervical: No cervical adenopathy.  Skin:    General: Skin is warm and dry.     Coloration: Skin is not pale.     Findings: No erythema or rash.     Comments: Solar lentigines diffusely   Neurological:     Mental Status: He is alert.     Cranial Nerves: No cranial nerve deficit.     Motor: No abnormal muscle tone.     Coordination: Coordination normal.     Gait: Gait normal.     Deep Tendon Reflexes: Reflexes are normal and symmetric. Reflexes normal.  Psychiatric:        Mood and Affect: Mood normal.        Cognition and Memory: Cognition normal.     Comments: Mood seems good today Candidly discusses symptoms and stressors             Assessment & Plan:   Problem List Items Addressed This Visit       Other   Colon cancer screening    Encouraged pt to renew cologuard / due in July  Will call for order if needed   Declines colonoscopy unless this is positive       Depressed mood    Pt thinks this may be due to long work hours/ no down time and less exercise  Discussed ways to combat this If worse would propose counseling ref Testosterone level is in the normal range       Elevated glucose level    Lab Results  Component Value Date   HGBA1C 5.7 06/17/2022  Stable disc imp of low glycemic diet and wt loss to prevent DM2       Lack of motivation    Watching for signs of clinical depression  PHQ of 4  Encouraged some lifestyle changes       Prostate cancer screening    Lab Results  Component Value Date   PSA 0.27 06/17/2022   PSA 0.23 06/15/2021   PSA 0.29 06/17/2020   No voiding symptoms or changes       Routine general medical examination at a health care facility - Primary    Reviewed health habits including diet and exercise and skin cancer prevention Reviewed appropriate screening  tests for age  Also reviewed health mt list, fam hx and immunization status , as well as social and family history   See HPI Labs reviewed and ordered Encouraged pt to renew cologuard (due in July) - he declines colonoscopy unless this is positive Encouraged to call pharmacy re: cost of shingrix and get if affordable Discussed mood/motivation and need for more down time and strength building exercise  Psa is in normal range  Testosterone level is normal as well

## 2022-06-25 NOTE — Assessment & Plan Note (Signed)
Lab Results  Component Value Date   PSA 0.27 06/17/2022   PSA 0.23 06/15/2021   PSA 0.29 06/17/2020    No voiding symptoms or changes

## 2022-06-25 NOTE — Assessment & Plan Note (Signed)
Encouraged pt to renew cologuard / due in July  Will call for order if needed   Declines colonoscopy unless this is positive

## 2022-06-25 NOTE — Patient Instructions (Addendum)
You need some down time and time for exercise    You will need to renew cologuard  Go ahead and do that - if you need order let us know  Due in July   If you are interested in the new shingles vaccine (Shingrix) - call your local pharmacy to check on coverage and availability  If affordable, get on a wait list at your pharmacy to get the vaccine.   Labs look stable   Take care of yourself

## 2022-06-25 NOTE — Assessment & Plan Note (Signed)
Lab Results  Component Value Date   HGBA1C 5.7 06/17/2022   Stable disc imp of low glycemic diet and wt loss to prevent DM2

## 2022-06-25 NOTE — Assessment & Plan Note (Signed)
Reviewed health habits including diet and exercise and skin cancer prevention Reviewed appropriate screening tests for age  Also reviewed health mt list, fam hx and immunization status , as well as social and family history   See HPI Labs reviewed and ordered Encouraged pt to renew cologuard (due in July) - he declines colonoscopy unless this is positive Encouraged to call pharmacy re: cost of shingrix and get if affordable Discussed mood/motivation and need for more down time and strength building exercise  Psa is in normal range  Testosterone level is normal as well

## 2022-06-25 NOTE — Assessment & Plan Note (Signed)
Pt thinks this may be due to long work hours/ no down time and less exercise  Discussed ways to combat this If worse would propose counseling ref Testosterone level is in the normal range

## 2022-06-25 NOTE — Assessment & Plan Note (Signed)
Watching for signs of clinical depression  PHQ of 4  Encouraged some lifestyle changes

## 2022-07-28 ENCOUNTER — Encounter: Payer: Self-pay | Admitting: Family Medicine

## 2022-07-28 DIAGNOSIS — Z1211 Encounter for screening for malignant neoplasm of colon: Secondary | ICD-10-CM

## 2022-08-04 DIAGNOSIS — Z1211 Encounter for screening for malignant neoplasm of colon: Secondary | ICD-10-CM | POA: Diagnosis not present

## 2022-08-11 LAB — COLOGUARD: COLOGUARD: NEGATIVE

## 2022-08-11 NOTE — Telephone Encounter (Signed)
Patient called in returning call he received. Relayed message below to patient.

## 2022-09-23 DIAGNOSIS — H40013 Open angle with borderline findings, low risk, bilateral: Secondary | ICD-10-CM | POA: Diagnosis not present

## 2022-09-29 ENCOUNTER — Encounter: Payer: Self-pay | Admitting: Family Medicine

## 2023-01-05 DIAGNOSIS — X32XXXD Exposure to sunlight, subsequent encounter: Secondary | ICD-10-CM | POA: Diagnosis not present

## 2023-01-05 DIAGNOSIS — Z1283 Encounter for screening for malignant neoplasm of skin: Secondary | ICD-10-CM | POA: Diagnosis not present

## 2023-01-05 DIAGNOSIS — D225 Melanocytic nevi of trunk: Secondary | ICD-10-CM | POA: Diagnosis not present

## 2023-01-05 DIAGNOSIS — L570 Actinic keratosis: Secondary | ICD-10-CM | POA: Diagnosis not present

## 2023-03-31 DIAGNOSIS — H40013 Open angle with borderline findings, low risk, bilateral: Secondary | ICD-10-CM | POA: Diagnosis not present

## 2023-06-28 ENCOUNTER — Telehealth: Payer: Self-pay | Admitting: Family Medicine

## 2023-06-28 DIAGNOSIS — Z125 Encounter for screening for malignant neoplasm of prostate: Secondary | ICD-10-CM

## 2023-06-28 DIAGNOSIS — Z0000 Encounter for general adult medical examination without abnormal findings: Secondary | ICD-10-CM

## 2023-06-28 DIAGNOSIS — R7309 Other abnormal glucose: Secondary | ICD-10-CM

## 2023-06-28 NOTE — Telephone Encounter (Signed)
 Called  pt and schedule a appt for cpe

## 2023-06-28 NOTE — Telephone Encounter (Signed)
 Copied from CRM [id number]. Topic: Appointments - Scheduling Inquiry for Clinic >> Jun 28, 2023  1:55 PM Albertha Alosa wrote: Reason for CRM: Patient called in regarding physical visit 06/02, needs to schedule for labs once orders is in the chart

## 2023-06-28 NOTE — Telephone Encounter (Signed)
-----   Message from Gerry Krone sent at 06/28/2023  4:02 PM EDT ----- Regarding: Lab orders for Wed, 5.28.25 Patient is scheduled for CPX labs, please order future labs, Thanks , Anselmo Kings

## 2023-06-28 NOTE — Telephone Encounter (Signed)
 Please schedule fasting labs prior to CPE (PCP will place orders)

## 2023-06-29 ENCOUNTER — Other Ambulatory Visit (INDEPENDENT_AMBULATORY_CARE_PROVIDER_SITE_OTHER)

## 2023-06-29 DIAGNOSIS — Z0000 Encounter for general adult medical examination without abnormal findings: Secondary | ICD-10-CM

## 2023-06-29 DIAGNOSIS — Z125 Encounter for screening for malignant neoplasm of prostate: Secondary | ICD-10-CM

## 2023-06-29 DIAGNOSIS — Z136 Encounter for screening for cardiovascular disorders: Secondary | ICD-10-CM | POA: Diagnosis not present

## 2023-06-29 DIAGNOSIS — R7309 Other abnormal glucose: Secondary | ICD-10-CM

## 2023-06-29 LAB — LIPID PANEL
Cholesterol: 178 mg/dL (ref 0–200)
HDL: 67.3 mg/dL (ref >39.00)
LDL Cholesterol: 96 mg/dL (ref 0–99)
NonHDL: 110.88
Total CHOL/HDL Ratio: 3
Triglycerides: 75 mg/dL (ref 0.0–149.0)
VLDL: 15 mg/dL (ref 0.0–40.0)

## 2023-06-29 LAB — COMPREHENSIVE METABOLIC PANEL WITH GFR
ALT: 14 U/L (ref 0–53)
AST: 16 U/L (ref 0–37)
Albumin: 4.2 g/dL (ref 3.5–5.2)
Alkaline Phosphatase: 119 U/L — ABNORMAL HIGH (ref 39–117)
BUN: 15 mg/dL (ref 6–23)
CO2: 32 meq/L (ref 19–32)
Calcium: 9.1 mg/dL (ref 8.4–10.5)
Chloride: 103 meq/L (ref 96–112)
Creatinine, Ser: 0.83 mg/dL (ref 0.40–1.50)
GFR: 92.53 mL/min (ref >60.00)
Glucose, Bld: 103 mg/dL — ABNORMAL HIGH (ref 70–99)
Potassium: 4.3 meq/L (ref 3.5–5.1)
Sodium: 141 meq/L (ref 135–145)
Total Bilirubin: 0.6 mg/dL (ref 0.2–1.2)
Total Protein: 6.2 g/dL (ref 6.0–8.3)

## 2023-06-29 LAB — CBC WITH DIFFERENTIAL/PLATELET
Basophils Absolute: 0 10*3/uL (ref 0.0–0.1)
Basophils Relative: 0.9 % (ref 0.0–3.0)
Eosinophils Absolute: 0.2 10*3/uL (ref 0.0–0.7)
Eosinophils Relative: 4.1 % (ref 0.0–5.0)
HCT: 43.4 % (ref 39.0–52.0)
Hemoglobin: 14.7 g/dL (ref 13.0–17.0)
Lymphocytes Relative: 33.7 % (ref 12.0–46.0)
Lymphs Abs: 1.3 10*3/uL (ref 0.7–4.0)
MCHC: 34 g/dL (ref 30.0–36.0)
MCV: 93 fl (ref 78.0–100.0)
Monocytes Absolute: 0.4 10*3/uL (ref 0.1–1.0)
Monocytes Relative: 10.4 % (ref 3.0–12.0)
Neutro Abs: 1.9 10*3/uL (ref 1.4–7.7)
Neutrophils Relative %: 50.9 % (ref 43.0–77.0)
Platelets: 273 10*3/uL (ref 150.0–400.0)
RBC: 4.67 Mil/uL (ref 4.22–5.81)
RDW: 13.5 % (ref 11.5–15.5)
WBC: 3.8 10*3/uL — ABNORMAL LOW (ref 4.0–10.5)

## 2023-06-29 LAB — TSH: TSH: 3.01 u[IU]/mL (ref 0.35–5.50)

## 2023-06-29 LAB — HEMOGLOBIN A1C: Hgb A1c MFr Bld: 5.8 % (ref 4.6–6.5)

## 2023-06-29 LAB — PSA: PSA: 0.3 ng/mL (ref 0.10–4.00)

## 2023-06-30 ENCOUNTER — Ambulatory Visit: Payer: Self-pay | Admitting: Family Medicine

## 2023-06-30 ENCOUNTER — Other Ambulatory Visit

## 2023-07-04 ENCOUNTER — Encounter: Admitting: Family Medicine

## 2023-07-04 ENCOUNTER — Ambulatory Visit (INDEPENDENT_AMBULATORY_CARE_PROVIDER_SITE_OTHER): Admitting: Family Medicine

## 2023-07-04 ENCOUNTER — Encounter: Payer: Self-pay | Admitting: Family Medicine

## 2023-07-04 VITALS — BP 106/68 | HR 66 | Temp 98.3°F | Ht 67.25 in | Wt 150.1 lb

## 2023-07-04 DIAGNOSIS — R4589 Other symptoms and signs involving emotional state: Secondary | ICD-10-CM | POA: Diagnosis not present

## 2023-07-04 DIAGNOSIS — Z1211 Encounter for screening for malignant neoplasm of colon: Secondary | ICD-10-CM

## 2023-07-04 DIAGNOSIS — Z0000 Encounter for general adult medical examination without abnormal findings: Secondary | ICD-10-CM

## 2023-07-04 DIAGNOSIS — R7303 Prediabetes: Secondary | ICD-10-CM | POA: Diagnosis not present

## 2023-07-04 DIAGNOSIS — Z23 Encounter for immunization: Secondary | ICD-10-CM | POA: Diagnosis not present

## 2023-07-04 DIAGNOSIS — Z125 Encounter for screening for malignant neoplasm of prostate: Secondary | ICD-10-CM

## 2023-07-04 NOTE — Assessment & Plan Note (Signed)
 Reviewed health habits including diet and exercise and skin cancer prevention Reviewed appropriate screening tests for age  Also reviewed health mt list, fam hx and immunization status , as well as social and family history   See HPI Labs reviewed and ordered Health Maintenance  Topic Date Due   Hepatitis C Screening  07/03/2024*   HIV Screening  07/03/2024*   COVID-19 Vaccine (1 - 2024-25 season) 07/19/2024*   Zoster (Shingles) Vaccine (1 of 2) 10/03/2024*   Flu Shot  09/02/2023   Cologuard (Stool DNA test)  08/03/2025   DTaP/Tdap/Td vaccine (4 - Td or Tdap) 07/03/2033   HPV Vaccine  Aged Out   Meningitis B Vaccine  Aged Out  *Topic was postponed. The date shown is not the original due date.    Tdap given today  Not interested in shingrix yet Stable/normal psa  Cologuard utd Discussed fall prevention, supplements and exercise for bone density  PHQ 5 reviewed

## 2023-07-04 NOTE — Progress Notes (Signed)
 Subjective:    Patient ID: Patrick Mcpherson, male    DOB: February 09, 1960, 63 y.o.   MRN: [id number]  HPI  Here for health maintenance exam and to review chronic medical problems   Wt Readings from Last 3 Encounters:  07/04/23 150 lb 2 oz (68.1 kg)  06/25/22 153 lb (69.4 kg)  06/22/21 156 lb (70.8 kg)   23.34 kg/m  Vitals:   07/04/23 1534  BP: 106/68  Pulse: 66  Temp: 98.3 F (36.8 C)  SpO2: 97%    Immunization History  Administered Date(s) Administered   Influenza Split 12/02/2010   Influenza Whole 02/04/2010   Td 01/02/2003   Tdap 10/04/2012, 07/04/2023    There are no preventive care reminders to display for this patient.  Due for tetanus shot Has a new baby in the family   Shingrix - not interested yet    Prostate health Lab Results  Component Value Date   PSA 0.30 06/29/2023   PSA 0.27 06/17/2022   PSA 0.23 06/15/2021   No problems with urination  No nocturia   No history of prostate cancer in family     Colon cancer screening  Cologuard 08/2022 negative    Bone health   Falls- none  Fractures-none  Supplements -osteo bi flex has D in it    Exercise : more sporatic with house renovations / getting back to a routine  Walking program  Some dancing  Symmetry program- strength building /phase  Considering joining [facility] [employer]     Eating a more plant based diet  Some fish    Mood    07/04/2023    3:37 PM 06/25/2022    4:07 PM 06/22/2021   11:38 AM 06/20/2020    8:51 AM 06/26/2019    5:02 PM  Depression screen PHQ 2/9  Decreased Interest 0 1 0 0 0  Down, Depressed, Hopeless 1 1 0 0 0  PHQ - 2 Score 1 2 0 0 0  Altered sleeping 2 1 1 2 2   Tired, decreased energy 1 1 1  0 1  Change in appetite 0 0 0 0 0  Feeling bad or failure about yourself  1 0 1 1 1   Trouble concentrating 0 0 0 0 0  Moving slowly or fidgety/restless 0 0 0 0 0  Suicidal thoughts 0 0 0 0 0  PHQ-9 Score 5 4 3 3 4   Difficult doing work/chores Not difficult at all Somewhat  difficult Not difficult at all Not difficult at all    Fair  Still deals with some depression here and there /has a handle on it  Not as bad as in the past More positive now    Glucose Lab Results  Component Value Date   HGBA1C 5.8 06/29/2023   HGBA1C 5.7 06/17/2022   HGBA1C 5.7 06/15/2021   In prediabetic range   Mother had DM type 1    Lab Results  Component Value Date   WBC 3.8 (L) 06/29/2023   HGB 14.7 06/29/2023   HCT 43.4 06/29/2023   MCV 93.0 06/29/2023   PLT 273.0 06/29/2023  This wbc is baseline for him  Lab Results  Component Value Date   NA 141 06/29/2023   K 4.3 06/29/2023   CO2 32 06/29/2023   GLUCOSE 103 (H) 06/29/2023   BUN 15 06/29/2023   CREATININE 0.83 06/29/2023   CALCIUM 9.1 06/29/2023   GFR 92.53 06/29/2023   GFRNONAA 98.33 01/14/2010   Takes occational meloxicam -not often  Lab Results  Component Value Date   ALT 14 06/29/2023   AST 16 06/29/2023   ALKPHOS 119 (H) 06/29/2023   BILITOT 0.6 06/29/2023   Lab Results  Component Value Date   TSH 3.01 06/29/2023   Cholesterol Lab Results  Component Value Date   CHOL 178 06/29/2023   CHOL 185 06/17/2022   CHOL 189 06/15/2021   Lab Results  Component Value Date   HDL 67.30 06/29/2023   HDL 70.30 06/17/2022   HDL 70.90 06/15/2021   Lab Results  Component Value Date   LDLCALC 96 06/29/2023   LDLCALC 100 (H) 06/17/2022   LDLCALC 106 (H) 06/15/2021   Lab Results  Component Value Date   TRIG 75.0 06/29/2023   TRIG 70.0 06/17/2022   TRIG 64.0 06/15/2021   Lab Results  Component Value Date   CHOLHDL 3 06/29/2023   CHOLHDL 3 06/17/2022   CHOLHDL 3 06/15/2021   Lab Results  Component Value Date   LDLDIRECT 120.9 11/24/2010   LDLDIRECT 132.0 01/14/2010       Patient Active Problem List   Diagnosis Date Noted   Depressed mood 05/26/2022   Lack of motivation 05/26/2022   Bilateral primary osteoarthritis of hip 02/07/2019   Prediabetes 06/20/2017   Lumbar back pain  with radiculopathy affecting left lower extremity 06/17/2014   Colon cancer screening 09/29/2011   Routine general medical examination at a health care facility 11/23/2010   Prostate cancer screening 11/23/2010   Tinnitus 01/21/2010   Past Medical History:  Diagnosis Date   Anxiety    Stress reaction   Hyperlipidemia    borderline   Past Surgical History:  Procedure Laterality Date   RHINOPLASTY     VASECTOMY     Social History   Tobacco Use   Smoking status: Never   Smokeless tobacco: Never  Vaping Use   Vaping status: Never Used  Substance Use Topics   Alcohol use: Yes    Alcohol/week: 0.0 standard drinks of alcohol    Comment: RARELY   Drug use: No   Family History  Problem Relation Age of Onset   Heart disease Mother        CAD, bypass   Diabetes Mother    Cancer Father        Lymphoma   Allergies  Allergen Reactions   Shellfish Allergy     REACTION: dizzy and nauseated   Current Outpatient Medications on File Prior to Visit  Medication Sig Dispense Refill   meloxicam (MOBIC) 15 MG tablet Take 15 mg by mouth daily as needed.     Misc Natural Products (OSTEO BI-FLEX ADV DOUBLE ST PO) Take 1 tablet by mouth 2 (two) times daily.     TURMERIC PO Take 2 tablets by mouth daily.     No current facility-administered medications on file prior to visit.    Review of Systems  Constitutional:  Negative for activity change, appetite change, fatigue, fever and unexpected weight change.  HENT:  Negative for congestion, rhinorrhea, sore throat and trouble swallowing.   Eyes:  Negative for pain, redness, itching and visual disturbance.  Respiratory:  Negative for cough, chest tightness, shortness of breath and wheezing.   Cardiovascular:  Negative for chest pain and palpitations.  Gastrointestinal:  Negative for abdominal pain, blood in stool, constipation, diarrhea and nausea.  Endocrine: Negative for cold intolerance, heat intolerance, polydipsia and polyuria.   Genitourinary:  Negative for difficulty urinating, dysuria, frequency and urgency.  Musculoskeletal:  Positive for arthralgias. Negative  for joint swelling and myalgias.  Skin:  Negative for pallor and rash.  Neurological:  Negative for dizziness, tremors, weakness, numbness and headaches.  Hematological:  Negative for adenopathy. Does not bruise/bleed easily.  Psychiatric/Behavioral:  Negative for decreased concentration and dysphoric mood. The patient is not nervous/anxious.        Objective:   Physical Exam Constitutional:      General: He is not in acute distress.    Appearance: Normal appearance. He is well-developed and normal weight. He is not ill-appearing or diaphoretic.  HENT:     Head: Normocephalic and atraumatic.     Right Ear: Tympanic membrane, ear canal and external ear normal.     Left Ear: Tympanic membrane, ear canal and external ear normal.     Nose: Nose normal. No congestion.     Mouth/Throat:     Mouth: Mucous membranes are moist.     Pharynx: Oropharynx is clear. No posterior oropharyngeal erythema.  Eyes:     General: No scleral icterus.       Right eye: No discharge.        Left eye: No discharge.     Conjunctiva/sclera: Conjunctivae normal.     Pupils: Pupils are equal, round, and reactive to light.  Neck:     Thyroid : No thyromegaly.     Vascular: No carotid bruit or JVD.  Cardiovascular:     Rate and Rhythm: Normal rate and regular rhythm.     Pulses: Normal pulses.     Heart sounds: Normal heart sounds.     No gallop.  Pulmonary:     Effort: Pulmonary effort is normal. No respiratory distress.     Breath sounds: Normal breath sounds. No wheezing or rales.     Comments: Good air exch Chest:     Chest wall: No tenderness.  Abdominal:     General: Bowel sounds are normal. There is no distension or abdominal bruit.     Palpations: Abdomen is soft. There is no mass.     Tenderness: There is no abdominal tenderness.     Hernia: No hernia is  present.  Musculoskeletal:        General: No tenderness.     Cervical back: Normal range of motion and neck supple. No rigidity. No muscular tenderness.     Right lower leg: No edema.     Left lower leg: No edema.  Lymphadenopathy:     Cervical: No cervical adenopathy.  Skin:    General: Skin is warm and dry.     Coloration: Skin is not pale.     Findings: No erythema or rash.     Comments: Solar lentigines diffusely   Neurological:     Mental Status: He is alert.     Cranial Nerves: No cranial nerve deficit.     Motor: No abnormal muscle tone.     Coordination: Coordination normal.     Gait: Gait normal.     Deep Tendon Reflexes: Reflexes are normal and symmetric. Reflexes normal.  Psychiatric:        Mood and Affect: Mood normal.        Cognition and Memory: Cognition and memory normal.           Assessment & Plan:   Problem List Items Addressed This Visit       Other   Routine general medical examination at a health care facility - Primary   Reviewed health habits including diet and exercise and skin cancer  prevention Reviewed appropriate screening tests for age  Also reviewed health mt list, fam hx and immunization status , as well as social and family history   See HPI Labs reviewed and ordered Health Maintenance  Topic Date Due   Hepatitis C Screening  07/03/2024*   HIV Screening  07/03/2024*   COVID-19 Vaccine (1 - 2024-25 season) 07/19/2024*   Zoster (Shingles) Vaccine (1 of 2) 10/03/2024*   Flu Shot  09/02/2023   Cologuard (Stool DNA test)  08/03/2025   DTaP/Tdap/Td vaccine (4 - Td or Tdap) 07/03/2033   HPV Vaccine  Aged Out   Meningitis B Vaccine  Aged Out  *Topic was postponed. The date shown is not the original due date.    Tdap given today  Not interested in shingrix yet Stable/normal psa  Cologuard utd Discussed fall prevention, supplements and exercise for bone density  PHQ 5 reviewed       Prostate cancer screening   Lab Results   Component Value Date   PSA 0.30 06/29/2023   PSA 0.27 06/17/2022   PSA 0.23 06/15/2021    No symptoms or family history       Prediabetes   Lab Results  Component Value Date   HGBA1C 5.8 06/29/2023   HGBA1C 5.7 06/17/2022   HGBA1C 5.7 06/15/2021   disc imp of low glycemic diet and wt loss to prevent DM2       Depressed mood   Overall some improvement  Declines need for counseling or treatment  Encouraged good self care and exercise       Colon cancer screening   Cologuard 08/2022 negative       Other Visit Diagnoses       Need for Tdap vaccination       Relevant Orders   Tdap vaccine greater than or equal to 7yo IM (Completed)

## 2023-07-04 NOTE — Patient Instructions (Addendum)
 If you are interested in the shingles vaccine series (Shingrix), call your insurance or pharmacy to check on coverage and location it must be given.  If affordable - you can schedule it here or at your pharmacy depending on coverage   Tdap vaccine today    Keep walking Add some strength training to your routine, this is important for bone and brain health and can reduce your risk of falls and help your body use insulin properly and regulate weight  Light weights, exercise bands , and internet videos are a good way to start  Yoga (chair or regular), machines , floor exercises or a gym with machines are also good options    To prevent diabetes Try to get most of your carbohydrates from produce (with the exception of white potatoes) and whole grains Eat less bread/pasta/rice/snack foods/cereals/sweets and other items from the middle of the grocery store (processed carbs)  Labs are stable     The following are examples of protein in diet   Fish  Eggs  Dairy products  Soy products  Oat milk  Almond milk Nuts and nut butters  Dried beans  Legumes

## 2023-07-04 NOTE — Assessment & Plan Note (Signed)
 Overall some improvement  Declines need for counseling or treatment  Encouraged good self care and exercise

## 2023-07-04 NOTE — Assessment & Plan Note (Signed)
 Lab Results  Component Value Date   HGBA1C 5.8 06/29/2023   HGBA1C 5.7 06/17/2022   HGBA1C 5.7 06/15/2021   disc imp of low glycemic diet and wt loss to prevent DM2

## 2023-07-04 NOTE — Assessment & Plan Note (Signed)
 Lab Results  Component Value Date   PSA 0.30 06/29/2023   PSA 0.27 06/17/2022   PSA 0.23 06/15/2021    No symptoms or family history

## 2023-07-04 NOTE — Assessment & Plan Note (Signed)
 Cologuard 08/2022 negative

## 2023-09-29 DIAGNOSIS — H40013 Open angle with borderline findings, low risk, bilateral: Secondary | ICD-10-CM | POA: Diagnosis not present
